# Patient Record
Sex: Female | Born: 1965 | Race: Black or African American | Hispanic: No | Marital: Single | Smoking: Never smoker
Health system: Southern US, Community
[De-identification: ages and names within clinical notes are randomized; demographics above are authoritative.]

## PROBLEM LIST (undated history)

## (undated) DIAGNOSIS — N390 Urinary tract infection, site not specified: Secondary | ICD-10-CM

## (undated) DIAGNOSIS — N920 Excessive and frequent menstruation with regular cycle: Secondary | ICD-10-CM

## (undated) DIAGNOSIS — N946 Dysmenorrhea, unspecified: Secondary | ICD-10-CM

## (undated) DIAGNOSIS — B019 Varicella without complication: Secondary | ICD-10-CM

## (undated) DIAGNOSIS — M1990 Unspecified osteoarthritis, unspecified site: Secondary | ICD-10-CM

## (undated) DIAGNOSIS — R32 Unspecified urinary incontinence: Secondary | ICD-10-CM

## (undated) HISTORY — PX: NO PAST SURGERIES: SHX2092

## (undated) HISTORY — DX: Excessive and frequent menstruation with regular cycle: N92.0

## (undated) HISTORY — DX: Unspecified urinary incontinence: R32

## (undated) HISTORY — DX: Varicella without complication: B01.9

## (undated) HISTORY — DX: Unspecified osteoarthritis, unspecified site: M19.90

## (undated) HISTORY — DX: Urinary tract infection, site not specified: N39.0

## (undated) HISTORY — PX: WISDOM TOOTH EXTRACTION: SHX21

## (undated) HISTORY — DX: Dysmenorrhea, unspecified: N94.6

## (undated) SURGERY — Surgical Case
Anesthesia: *Unknown

---

## 2012-11-20 HISTORY — PX: BUNIONECTOMY: SHX129

## 2013-01-28 ENCOUNTER — Encounter (HOSPITAL_COMMUNITY): Payer: Self-pay | Admitting: Pharmacist

## 2013-02-11 ENCOUNTER — Encounter (HOSPITAL_COMMUNITY)
Admission: RE | Admit: 2013-02-11 | Discharge: 2013-02-11 | Disposition: A | Discharge status: Home / Self Care | Payer: Commercial Managed Care - PPO | Source: Ambulatory Visit | Attending: Obstetrics and Gynecology | Admitting: Obstetrics and Gynecology

## 2013-02-11 ENCOUNTER — Other Ambulatory Visit: Payer: Self-pay | Admitting: Obstetrics and Gynecology

## 2013-02-11 ENCOUNTER — Encounter (HOSPITAL_COMMUNITY): Payer: Self-pay

## 2013-02-11 ENCOUNTER — Telehealth: Payer: Self-pay | Admitting: Radiology

## 2013-02-11 DIAGNOSIS — E876 Hypokalemia: Secondary | ICD-10-CM

## 2013-02-11 LAB — CBC
Hemoglobin: 12.3 g/dL (ref 12.0–15.0)
MCH: 30.5 pg (ref 26.0–34.0)
MCHC: 33 g/dL (ref 30.0–36.0)
MCV: 92.6 fL (ref 78.0–100.0)
RBC: 4.03 MIL/uL (ref 3.87–5.11)

## 2013-02-11 LAB — BASIC METABOLIC PANEL
BUN: 17 mg/dL (ref 6–23)
CO2: 28 mEq/L (ref 19–32)
Calcium: 9.7 mg/dL (ref 8.4–10.5)
Creatinine, Ser: 0.8 mg/dL (ref 0.50–1.10)
GFR calc non Af Amer: 88 mL/min — ABNORMAL LOW (ref >90)
Glucose, Bld: 84 mg/dL (ref 70–99)

## 2013-02-11 NOTE — Pre-Procedure Instructions (Signed)
Dr. Rodman Pickle notified of abnormal EKG, and advises to let pt's primary  MD know about EKG in case they want to follow up. Pt sees doctors at [facility]. I faxed copy of EKG to Dr. Tresa Res (c/o Kennon Rounds) and I notified Urgent Care.

## 2013-02-11 NOTE — Telephone Encounter (Signed)
Surgical center called to see if this patient is ours, she has stated she goes to Urgent care. I advised she does not seem to be established here.

## 2013-02-11 NOTE — Patient Instructions (Addendum)
Your procedure is scheduled on:02/17/13  Enter through the Main Entrance at :6am Pick up desk phone and dial [phone] and inform us of your arrival.  Please call [phone] if you have any problems the morning of surgery.  Remember: Do not eat or drink after midnight: Sunday   Take these meds the morning of surgery with a sip of water:Blood pressure med  DO NOT wear jewelry, eye make-up, lipstick,body lotion, or dark fingernail polish. Do not shave for 48 hours prior to surgery.  If you are to be admitted after surgery, leave suitcase in car until your room has been assigned. Patients discharged on the day of surgery will not be allowed to drive home.

## 2013-02-12 ENCOUNTER — Other Ambulatory Visit: Payer: Self-pay | Admitting: Obstetrics and Gynecology

## 2013-02-12 ENCOUNTER — Telehealth: Payer: Self-pay | Admitting: Obstetrics and Gynecology

## 2013-02-12 MED ORDER — POTASSIUM CHLORIDE ER 20 MEQ PO TBCR: 1.0000 | EXTENDED_RELEASE_TABLET | Freq: Every day | ORAL | Status: DC

## 2013-02-12 NOTE — Pre-Procedure Instructions (Signed)
Informed Dr Rodman Pickle of patient's low potassium level from yesterday's PAT with Marylu Lund - "K" level 2.9.  Cline Crock at Dr Romine's office to inform her that the patient needs potassium supplements per Dr Rodman Pickle prior to the surgery date of 02/17/13.  Fax a copy of the lab results to [facility] at Dr Romine's office.  Will repeat stat K level per Dr Rodman Pickle on [facility] 02/17/13 in [facility] prior to surgery.

## 2013-02-12 NOTE — Telephone Encounter (Signed)
Okey Regal from [facility] called to notify Dr Tresa Res that patients K+ level 2.9 and Dr Rodman Pickle in anesthesia wants the patient to be on K+ supplement until surgery.  Dr Rodman Pickle feels this is cause of EKG abnormality and will not cancel the surgery based on this.  See copy of fax reports from hospital.

## 2013-02-12 NOTE — Telephone Encounter (Signed)
Message copied by Ernest Haber on Wed Feb 12, 2013  3:04 PM ------      Message from: Meredeth Ide P      Created: Wed Feb 12, 2013  1:57 PM       Please call pt and tell her I want her to start postassium supplement prior to her surgery, and I sent it in to her [facility].  She should go ahead and get it now and start it, once daily with food, and still get her blood checked as planned.             Thanks. ------

## 2013-02-12 NOTE — H&P (Signed)
Judy Carroll is an 47 y.o. female.   Chief Complaint: heavy vaginal bleeding HPI: 47 yo SBF G1P1 (SVD 7 # 9 oz) with disabling menorrhagia and dysmenorrhea, desires definitive therapy with R-TLH.  PUS done 2.26.2014 showed her uterus to be 11 x 5 x 6 cm with multiple fibroids, each 1-3 cm.  Adnexa n.  EMB benign.  Past Medical History  Diagnosis Date  . Hypertension     Past Surgical History  Procedure Laterality Date  . No past surgeries      No family history on file. Social History:  reports that she has never smoked. She does not have any smokeless tobacco history on file. She reports that  drinks alcohol. She reports that she does not use illicit drugs.Daughter Fleet Contras will be support person.  Allergies: No Known Allergies  Meds:  Lisinopril 20/25 mg po qd  Results for orders placed during the hospital encounter of 02/11/13 (from the past 48 hour(s))  SURGICAL PCR SCREEN     Status: None   Collection Time    02/11/13  4:00 PM      Result Value Range   MRSA, PCR NEGATIVE  NEGATIVE   Staphylococcus aureus NEGATIVE  NEGATIVE   Comment:            The Xpert SA Assay (FDA     approved for NASAL specimens     in patients over 45 years of age),     is one component of     a comprehensive surveillance     program.  Test performance has     been validated by [employer] for patients greater     than or equal to 54 year old.     It is not intended     to diagnose infection nor to     guide or monitor treatment.  CBC     Status: None   Collection Time    02/11/13  4:25 PM      Result Value Range   WBC 8.7  4.0 - 10.5 K/uL   RBC 4.03  3.87 - 5.11 MIL/uL   Hemoglobin 12.3  12.0 - 15.0 g/dL   HCT 16.1  09.6 - 04.5 %   MCV 92.6  78.0 - 100.0 fL   MCH 30.5  26.0 - 34.0 pg   MCHC 33.0  30.0 - 36.0 g/dL   RDW 40.9  81.1 - 91.4 %   Platelets 383  150 - 400 K/uL  BASIC METABOLIC PANEL     Status: Abnormal   Collection Time    02/11/13  4:25 PM      Result Value Range   Sodium 136  135 - 145 mEq/L   Potassium 2.9 (*) 3.5 - 5.1 mEq/L   Chloride 98  96 - 112 mEq/L   CO2 28  19 - 32 mEq/L   Glucose, Bld 84  70 - 99 mg/dL   BUN 17  6 - 23 mg/dL   Creatinine, Ser 7.82  0.50 - 1.10 mg/dL   Calcium 9.7  8.4 - 95.6 mg/dL   GFR calc non Af Amer 88 (*) >90 mL/min   GFR calc Af Amer >90  >90 mL/min   Comment:            The eGFR has been calculated     using the CKD EPI equation.     This calculation has not been     validated in all clinical  situations.     eGFR's persistently     <90 mL/min signify     possible Chronic Kidney Disease.   No results found.  Review of Systems  All other systems reviewed and are negative.    There were no vitals taken for this visit. Physical Exam  Constitutional: She is oriented to person, place, and time. She appears well-developed and well-nourished.  HENT:  Head: Normocephalic and atraumatic.  Eyes: Conjunctivae are normal.  Neck: No thyromegaly present.  Cardiovascular: Normal rate and regular rhythm.   Respiratory: Effort normal and breath sounds normal.  GI: Soft. Bowel sounds are normal. There is no tenderness.  Genitourinary: Vagina normal.  Uterus difficult to palpate but on USG was 11 x 5 x 6 cm and adnexa nl.  Musculoskeletal: Normal range of motion.  Neurological: She is alert and oriented to person, place, and time.  Skin: Skin is warm and dry.  Psychiatric: She has a normal mood and affect.     Assessment/Plan Menorrhagia and dysmenorrhea, with fibroids, for R-TLH and bilat salpingectomy.  ROMINE,CYNTHIA P 02/12/2013, 3:41 PM

## 2013-02-12 NOTE — Telephone Encounter (Signed)
Pt is having surgery with Dr.Romine on Monday and wants to know if she can have her labs done at some where else.

## 2013-02-12 NOTE — Telephone Encounter (Signed)
T/C to pt. Advised her that Dr. Tresa Res would like her to start a potassium supplement once a day with food prior to her surgery. And a Rx was called to her [employer], still have labs done as planned. cm

## 2013-02-13 ENCOUNTER — Other Ambulatory Visit: Payer: Self-pay | Admitting: *Deleted

## 2013-02-13 ENCOUNTER — Other Ambulatory Visit: Payer: Self-pay | Admitting: Obstetrics & Gynecology

## 2013-02-13 DIAGNOSIS — E876 Hypokalemia: Secondary | ICD-10-CM

## 2013-02-14 LAB — POTASSIUM: Potassium: 3.8 mEq/L (ref 3.5–5.3)

## 2013-02-16 MED ORDER — DEXTROSE 5 % IV SOLN
2.0000 g | INTRAVENOUS | Status: AC
  Administered 2013-02-17: 2 g via INTRAVENOUS
  Filled 2013-02-16: qty 2

## 2013-02-17 ENCOUNTER — Encounter (HOSPITAL_COMMUNITY)
Admission: RE | Disposition: A | Discharge status: Home / Self Care | Payer: Self-pay | Source: Ambulatory Visit | Attending: Obstetrics and Gynecology

## 2013-02-17 ENCOUNTER — Encounter (HOSPITAL_COMMUNITY): Payer: Self-pay | Admitting: Anesthesiology

## 2013-02-17 ENCOUNTER — Ambulatory Visit (HOSPITAL_COMMUNITY): Payer: Commercial Managed Care - PPO | Admitting: Anesthesiology

## 2013-02-17 ENCOUNTER — Ambulatory Visit (HOSPITAL_COMMUNITY)
Admission: RE | Admit: 2013-02-17 | Discharge: 2013-02-17 | Disposition: A | Discharge status: Home / Self Care | Payer: Commercial Managed Care - PPO | Source: Ambulatory Visit | Attending: Obstetrics and Gynecology | Admitting: Obstetrics and Gynecology

## 2013-02-17 ENCOUNTER — Encounter (HOSPITAL_COMMUNITY): Payer: Self-pay | Admitting: *Deleted

## 2013-02-17 DIAGNOSIS — D251 Intramural leiomyoma of uterus: Secondary | ICD-10-CM | POA: Insufficient documentation

## 2013-02-17 DIAGNOSIS — N920 Excessive and frequent menstruation with regular cycle: Secondary | ICD-10-CM | POA: Insufficient documentation

## 2013-02-17 DIAGNOSIS — D259 Leiomyoma of uterus, unspecified: Secondary | ICD-10-CM

## 2013-02-17 DIAGNOSIS — N838 Other noninflammatory disorders of ovary, fallopian tube and broad ligament: Secondary | ICD-10-CM | POA: Insufficient documentation

## 2013-02-17 DIAGNOSIS — N946 Dysmenorrhea, unspecified: Secondary | ICD-10-CM | POA: Insufficient documentation

## 2013-02-17 HISTORY — PX: CYSTOSCOPY: SHX5120

## 2013-02-17 HISTORY — PX: BILATERAL SALPINGECTOMY: SHX5743

## 2013-02-17 HISTORY — PX: ROBOTIC ASSISTED TOTAL HYSTERECTOMY: SHX6085

## 2013-02-17 SURGERY — ROBOTIC ASSISTED TOTAL HYSTERECTOMY
Anesthesia: General

## 2013-02-17 MED ORDER — INDIGOTINDISULFONATE SODIUM 8 MG/ML IJ SOLN
INTRAMUSCULAR | Status: AC
  Filled 2013-02-17: qty 5

## 2013-02-17 MED ORDER — OXYCODONE-ACETAMINOPHEN 5-325 MG PO TABS
1.0000 | ORAL_TABLET | ORAL | Status: DC | PRN
  Administered 2013-02-17: 2 via ORAL
  Filled 2013-02-17: qty 2

## 2013-02-17 MED ORDER — ACETAMINOPHEN 10 MG/ML IV SOLN
INTRAVENOUS | Status: AC
  Administered 2013-02-17: 1000 mg via INTRAVENOUS
  Filled 2013-02-17: qty 100

## 2013-02-17 MED ORDER — ARTIFICIAL TEARS OP OINT
TOPICAL_OINTMENT | OPHTHALMIC | Status: AC
  Filled 2013-02-17: qty 3.5

## 2013-02-17 MED ORDER — GABAPENTIN 300 MG PO CAPS
600.0000 mg | ORAL_CAPSULE | Freq: Once | ORAL | Status: AC
  Administered 2013-02-17: 600 mg via ORAL
  Filled 2013-02-17: qty 2

## 2013-02-17 MED ORDER — DEXAMETHASONE SODIUM PHOSPHATE 4 MG/ML IJ SOLN
INTRAMUSCULAR | Status: DC | PRN
  Administered 2013-02-17: 10 mg via INTRAVENOUS

## 2013-02-17 MED ORDER — GLYCOPYRROLATE 0.2 MG/ML IJ SOLN
INTRAMUSCULAR | Status: DC | PRN
  Administered 2013-02-17: 0.1 mg via INTRAVENOUS

## 2013-02-17 MED ORDER — EPHEDRINE 5 MG/ML INJ
INTRAVENOUS | Status: DC | PRN
  Administered 2013-02-17: 15 mg via INTRAVENOUS
  Administered 2013-02-17: 10 mg via INTRAVENOUS

## 2013-02-17 MED ORDER — EPHEDRINE 5 MG/ML INJ
INTRAVENOUS | Status: AC
  Filled 2013-02-17: qty 10

## 2013-02-17 MED ORDER — PROPOFOL 10 MG/ML IV EMUL
INTRAVENOUS | Status: AC
  Filled 2013-02-17: qty 20

## 2013-02-17 MED ORDER — MORPHINE SULFATE 4 MG/ML IJ SOLN: 1.0000 mg | INTRAMUSCULAR | Status: DC | PRN

## 2013-02-17 MED ORDER — CELECOXIB 200 MG PO CAPS
400.0000 mg | ORAL_CAPSULE | ORAL | Status: AC
  Administered 2013-02-17: 400 mg via ORAL
  Filled 2013-02-17: qty 2

## 2013-02-17 MED ORDER — MIDAZOLAM HCL 2 MG/2ML IJ SOLN
INTRAMUSCULAR | Status: AC
  Filled 2013-02-17: qty 2

## 2013-02-17 MED ORDER — PROPOFOL 10 MG/ML IV EMUL
INTRAVENOUS | Status: DC | PRN
  Administered 2013-02-17: 170 mg via INTRAVENOUS
  Administered 2013-02-17: 30 mg via INTRAVENOUS

## 2013-02-17 MED ORDER — ROCURONIUM BROMIDE 100 MG/10ML IV SOLN
INTRAVENOUS | Status: DC | PRN
  Administered 2013-02-17: 20 mg via INTRAVENOUS
  Administered 2013-02-17: 5 mg via INTRAVENOUS
  Administered 2013-02-17: 4 mg via INTRAVENOUS
  Administered 2013-02-17: 45 mg via INTRAVENOUS

## 2013-02-17 MED ORDER — ONDANSETRON HCL 4 MG/2ML IJ SOLN
INTRAMUSCULAR | Status: AC
  Filled 2013-02-17: qty 2

## 2013-02-17 MED ORDER — GLYCOPYRROLATE 0.2 MG/ML IJ SOLN
INTRAMUSCULAR | Status: AC
  Filled 2013-02-17: qty 1

## 2013-02-17 MED ORDER — LIDOCAINE HCL (CARDIAC) 20 MG/ML IV SOLN
INTRAVENOUS | Status: AC
  Filled 2013-02-17: qty 5

## 2013-02-17 MED ORDER — LACTATED RINGERS IR SOLN
Status: DC | PRN
  Administered 2013-02-17: 3000 mL

## 2013-02-17 MED ORDER — LIDOCAINE HCL (CARDIAC) 20 MG/ML IV SOLN
INTRAVENOUS | Status: DC | PRN
  Administered 2013-02-17 (×2): 50 mg via INTRAVENOUS

## 2013-02-17 MED ORDER — MIDAZOLAM HCL 5 MG/5ML IJ SOLN
INTRAMUSCULAR | Status: DC | PRN
  Administered 2013-02-17: 2 mg via INTRAVENOUS

## 2013-02-17 MED ORDER — NEOSTIGMINE METHYLSULFATE 1 MG/ML IJ SOLN
INTRAMUSCULAR | Status: AC
  Filled 2013-02-17: qty 1

## 2013-02-17 MED ORDER — DEXTROSE IN LACTATED RINGERS 5 % IV SOLN
INTRAVENOUS | Status: DC
  Administered 2013-02-17: 13:00:00 via INTRAVENOUS

## 2013-02-17 MED ORDER — FENTANYL CITRATE 0.05 MG/ML IJ SOLN
INTRAMUSCULAR | Status: AC
  Filled 2013-02-17: qty 5

## 2013-02-17 MED ORDER — ACETAMINOPHEN 10 MG/ML IV SOLN: 1000.0000 mg | Freq: Once | INTRAVENOUS | Status: AC

## 2013-02-17 MED ORDER — HYDROMORPHONE HCL PF 1 MG/ML IJ SOLN: 0.2500 mg | INTRAMUSCULAR | Status: DC | PRN

## 2013-02-17 MED ORDER — ROPIVACAINE HCL 5 MG/ML IJ SOLN
INTRAMUSCULAR | Status: DC | PRN
  Administered 2013-02-17: 90 mL via EPIDURAL

## 2013-02-17 MED ORDER — ROPIVACAINE HCL 5 MG/ML IJ SOLN
INTRAMUSCULAR | Status: AC
  Filled 2013-02-17: qty 60

## 2013-02-17 MED ORDER — LACTATED RINGERS IV SOLN
INTRAVENOUS | Status: DC
  Administered 2013-02-17 (×2): via INTRAVENOUS

## 2013-02-17 MED ORDER — DEXAMETHASONE SODIUM PHOSPHATE 10 MG/ML IJ SOLN
INTRAMUSCULAR | Status: AC
  Filled 2013-02-17: qty 1

## 2013-02-17 MED ORDER — HYDROMORPHONE HCL PF 1 MG/ML IJ SOLN
INTRAMUSCULAR | Status: AC
  Administered 2013-02-17: 0.5 mg via INTRAVENOUS
  Filled 2013-02-17: qty 1

## 2013-02-17 MED ORDER — FENTANYL CITRATE 0.05 MG/ML IJ SOLN
INTRAMUSCULAR | Status: DC | PRN
  Administered 2013-02-17 (×2): 100 ug via INTRAVENOUS
  Administered 2013-02-17: 50 ug via INTRAVENOUS

## 2013-02-17 MED ORDER — INDIGOTINDISULFONATE SODIUM 8 MG/ML IJ SOLN
INTRAMUSCULAR | Status: DC | PRN
  Administered 2013-02-17: 5 mL via INTRAVENOUS

## 2013-02-17 SURGICAL SUPPLY — 74 items
BAG URINE DRAINAGE (UROLOGICAL SUPPLIES) ×3 IMPLANT
BARRIER ADHS 3X4 INTERCEED (GAUZE/BANDAGES/DRESSINGS) ×3 IMPLANT
BENZOIN TINCTURE PRP APPL 2/3 (GAUZE/BANDAGES/DRESSINGS) ×3 IMPLANT
CHLORAPREP W/TINT 26ML (MISCELLANEOUS) ×3 IMPLANT
CONT PATH 16OZ SNAP LID 3702 (MISCELLANEOUS) ×3 IMPLANT
COVER MAYO STAND STRL (DRAPES) ×3 IMPLANT
COVER TABLE BACK 60X90 (DRAPES) ×6 IMPLANT
COVER TIP SHEARS 8 DVNC (MISCELLANEOUS) ×2 IMPLANT
COVER TIP SHEARS 8MM DA VINCI (MISCELLANEOUS) ×1
DECANTER SPIKE VIAL GLASS SM (MISCELLANEOUS) ×3 IMPLANT
DERMABOND ADVANCED (GAUZE/BANDAGES/DRESSINGS) ×1
DERMABOND ADVANCED .7 DNX12 (GAUZE/BANDAGES/DRESSINGS) ×2 IMPLANT
DRAPE HUG U DISPOSABLE (DRAPE) ×3 IMPLANT
DRAPE LG THREE QUARTER DISP (DRAPES) ×6 IMPLANT
DRAPE WARM FLUID 44X44 (DRAPE) ×3 IMPLANT
ELECT REM PT RETURN 9FT ADLT (ELECTROSURGICAL) ×3
ELECTRODE REM PT RTRN 9FT ADLT (ELECTROSURGICAL) ×2 IMPLANT
EVACUATOR SMOKE 8.L (FILTER) ×3 IMPLANT
GAUZE VASELINE 3X9 (GAUZE/BANDAGES/DRESSINGS) IMPLANT
GLOVE BIO SURGEON STRL SZ 6.5 (GLOVE) ×6 IMPLANT
GLOVE BIOGEL PI IND STRL 7.0 (GLOVE) ×4 IMPLANT
GLOVE BIOGEL PI INDICATOR 7.0 (GLOVE) ×2
GLOVE ECLIPSE 6.5 STRL STRAW (GLOVE) ×12 IMPLANT
GOWN STRL REIN XL XLG (GOWN DISPOSABLE) ×18 IMPLANT
GYRUS RUMI II 2.5CM BLUE (DISPOSABLE)
GYRUS RUMI II 3.5CM BLUE (DISPOSABLE)
GYRUS RUMI II 4.0CM BLUE (DISPOSABLE)
KIT ACCESSORY DA VINCI DISP (KITS) ×1
KIT ACCESSORY DVNC DISP (KITS) ×2 IMPLANT
LEGGING LITHOTOMY PAIR STRL (DRAPES) ×3 IMPLANT
NEEDLE INSUFFLATION 120MM (ENDOMECHANICALS) ×3 IMPLANT
OCCLUDER COLPOPNEUMO (BALLOONS) ×3 IMPLANT
PACK LAVH (CUSTOM PROCEDURE TRAY) ×3 IMPLANT
PAD PREP 24X48 CUFFED NSTRL (MISCELLANEOUS) ×6 IMPLANT
PLUG CATH AND CAP STER (CATHETERS) ×3 IMPLANT
PROTECTOR NERVE ULNAR (MISCELLANEOUS) ×6 IMPLANT
RUMI II 3.0CM BLUE KOH-EFFICIE (DISPOSABLE) IMPLANT
RUMI II GYRUS 2.5CM BLUE (DISPOSABLE) IMPLANT
RUMI II GYRUS 3.5CM BLUE (DISPOSABLE) IMPLANT
RUMI II GYRUS 4.0CM BLUE (DISPOSABLE) IMPLANT
SET CYSTO W/LG BORE CLAMP LF (SET/KITS/TRAYS/PACK) ×3 IMPLANT
SET IRRIG TUBING LAPAROSCOPIC (IRRIGATION / IRRIGATOR) ×3 IMPLANT
SOLUTION ELECTROLUBE (MISCELLANEOUS) ×3 IMPLANT
STRIP CLOSURE SKIN 1/4X4 (GAUZE/BANDAGES/DRESSINGS) ×3 IMPLANT
SUT VIC AB 0 CT1 27 (SUTURE) ×3
SUT VIC AB 0 CT1 27XBRD ANBCTR (SUTURE) ×6 IMPLANT
SUT VIC AB 0 CT1 27XBRD ANTBC (SUTURE) IMPLANT
SUT VICRYL 0 UR6 27IN ABS (SUTURE) ×3 IMPLANT
SUT VICRYL RAPIDE 4/0 PS 2 (SUTURE) ×6 IMPLANT
SUT VLOC 180 0 9IN  GS21 (SUTURE)
SUT VLOC 180 0 9IN GS21 (SUTURE) IMPLANT
SUT VLOC 180 3-0 9IN GS21 (SUTURE) ×3 IMPLANT
SYR 30ML LL (SYRINGE) ×3 IMPLANT
SYR 50ML LL SCALE MARK (SYRINGE) ×3 IMPLANT
SYRINGE 10CC LL (SYRINGE) ×3 IMPLANT
SYSTEM CONVERTIBLE TROCAR (TROCAR) IMPLANT
TIP RUMI ORANGE 6.7MMX12CM (TIP) IMPLANT
TIP UTERINE 5.1X6CM LAV DISP (MISCELLANEOUS) IMPLANT
TIP UTERINE 6.7X10CM GRN DISP (MISCELLANEOUS) ×3 IMPLANT
TIP UTERINE 6.7X6CM WHT DISP (MISCELLANEOUS) IMPLANT
TIP UTERINE 6.7X8CM BLUE DISP (MISCELLANEOUS) IMPLANT
TOWEL OR 17X24 6PK STRL BLUE (TOWEL DISPOSABLE) ×9 IMPLANT
TRAY FOLEY BAG SILVER LF 14FR (CATHETERS) ×3 IMPLANT
TROCAR 12M 150ML BLUNT (TROCAR) IMPLANT
TROCAR DILATING TIP 12MM 150MM (ENDOMECHANICALS) ×3 IMPLANT
TROCAR DISP BLADELESS 8 DVNC (TROCAR) ×2 IMPLANT
TROCAR DISP BLADELESS 8MM (TROCAR) ×1
TROCAR XCEL 12X100 BLDLESS (ENDOMECHANICALS) IMPLANT
TROCAR XCEL NON-BLD 5MMX100MML (ENDOMECHANICALS) ×3 IMPLANT
TROCAR XCEL OPT SLVE 5M 100M (ENDOMECHANICALS) ×3 IMPLANT
TUBING FILTER THERMOFLATOR (ELECTROSURGICAL) ×3 IMPLANT
TUBING INSUFFLATION 10FT LAP (TUBING) ×3 IMPLANT
WARMER LAPAROSCOPE (MISCELLANEOUS) ×3 IMPLANT
WATER STERILE IRR 1000ML POUR (IV SOLUTION) ×9 IMPLANT

## 2013-02-17 NOTE — Progress Notes (Signed)
Patient discharge via wheel chair

## 2013-02-17 NOTE — Interval H&P Note (Signed)
History and Physical Interval Note:  02/17/2013 7:15 AM  Judy Carroll  has presented today for surgery, with the diagnosis of Menorrhagia; Fibroids CPT 58573, [id number], 5200  The various methods of treatment have been discussed with the patient and family. After consideration of risks, benefits and other options for treatment, the patient has consented to  Procedure(s) with comments: ROBOTIC ASSISTED TOTAL HYSTERECTOMY (N/A) - with cysto as a surgical intervention .  The patient's history has been reviewed, patient examined, no change in status, stable for surgery.  I have reviewed the patient's chart and labs.  Questions were answered to the patient's satisfaction.     ROMINE,CYNTHIA P

## 2013-02-17 NOTE — Anesthesia Preprocedure Evaluation (Signed)
Anesthesia Evaluation  Patient identified by MRN, date of birth, ID band Patient awake    Reviewed: Allergy & Precautions, H&P , Patient's Chart, lab work & pertinent test results, reviewed documented beta blocker date and time   Airway Mallampati: IV TM Distance: >3 FB Neck ROM: full    Dental no notable dental hx.    Pulmonary  breath sounds clear to auscultation  Pulmonary exam normal       Cardiovascular hypertension, On Medications Rhythm:regular Rate:Normal     Neuro/Psych    GI/Hepatic   Endo/Other  Morbid obesity  Renal/GU      Musculoskeletal   Abdominal   Peds  Hematology   Anesthesia Other Findings Repeat K+ okay  Reproductive/Obstetrics                           Anesthesia Physical Anesthesia Plan  ASA: III  Anesthesia Plan: General   Post-op Pain Management:    Induction: Intravenous  Airway Management Planned: Oral ETT and Video Laryngoscope Planned  Additional Equipment:   Intra-op Plan:   Post-operative Plan:   Informed Consent: I have reviewed the patients History and Physical, chart, labs and discussed the procedure including the risks, benefits and alternatives for the proposed anesthesia with the patient or authorized representative who has indicated his/her understanding and acceptance.   Dental Advisory Given and Dental advisory given  Plan Discussed with: CRNA and Surgeon  Anesthesia Plan Comments: (  Discussed  general anesthesia, including possible nausea, instrumentation of airway, sore throat,pulmonary aspiration, etc. I asked if the were any outstanding questions, or  concerns before we proceeded. )        Anesthesia Quick Evaluation

## 2013-02-17 NOTE — Progress Notes (Signed)
MD called. Given report on patient status and ready for discharge. No new orders.

## 2013-02-17 NOTE — Anesthesia Postprocedure Evaluation (Signed)
Anesthesia Post Note  Patient: Judy Carroll  Procedure(s) Performed: Procedure(s) (LRB): ROBOTIC ASSISTED TOTAL HYSTERECTOMY (N/A) BILATERAL SALPINGECTOMY (Bilateral) CYSTOSCOPY (N/A)  Anesthesia type: General  Patient location: PACU  Post pain: Pain level controlled  Post assessment: Post-op Vital signs reviewed  Last Vitals:  Filed Vitals:   02/17/13 1015  BP: 126/67  Pulse: 75  Temp:   Resp: 23    Post vital signs: Reviewed  Level of consciousness: sedated  Complications: No apparent anesthesia complications

## 2013-02-17 NOTE — Transfer of Care (Signed)
Immediate Anesthesia Transfer of Care Note  Patient: Judy Carroll  Procedure(s) Performed: Procedure(s): ROBOTIC ASSISTED TOTAL HYSTERECTOMY (N/A) BILATERAL SALPINGECTOMY (Bilateral) CYSTOSCOPY (N/A)  Patient Location: PACU  Anesthesia Type:General  Level of Consciousness: awake, alert  and oriented  Airway & Oxygen Therapy: Patient Spontanous Breathing and Patient connected to nasal cannula oxygen  Post-op Assessment: Report given to PACU RN and Post -op Vital signs reviewed and stable  Post vital signs: Reviewed and stable  Complications: No apparent anesthesia complications

## 2013-02-17 NOTE — Op Note (Signed)
Preoperative diagnosis: Menorrhagia, fibroids Postoperative diagnosis: Same, path pending Procedure: Robotic assisted total laparoscopic hysterectomy, bilateral salpingectomy, cystoscopy Surgeon: Dr. Meredeth Ide Assistant: Dr. Leda Quail Anesthesia: Gen. endotracheal Estimated blood loss: 75 cc Complications: None  Procedure: The patient was taken to the operating room, and after she was given IV sedation, she was placed in the low dorsolithotomy position. General endotracheal anesthesia was induced. She was then prepped and draped in usual fashion.  A posterior weighted and anterior Deaver retractor were placed into the vagina.  The cervix was grasped on its anterior lip with a single-tooth tenaculum.  The cervix was dilated to a #19 Shawnie Pons.  The uterus sounded to 10 cm. A 10 cm Rumi manipulator with a small  KOH ring was placed without difficulty.  A Foley catheter was then placed.  Meanwhile the assistant had anesthetized the skin in the left upper quadrant with quarter percent ropivacaine, incised the skin, and inserted a 5 mm non-bladed trocar into the peritoneal space.  Proper placement was noted using the laparoscope.  Pneumoperitoneum was accomplished using the automatic insufflator.  The skin just above the umbilicus was anesthetized with quarter percent ropivacaine incised with a knife, and a 12 mm bladed trocar was then inserted into the peritoneal space and its proper placement noted with the laparoscope.  60 cc of quarter percent Ropivicaine was inserted into the peritoneal space.  Sites were marked for the robotic trocars ,the abdominal wall was transilluminated, and then the skin was anesthetized with quarter percent ropivicaine, incised with a knife, and the trocars were inserted under direct visualization.  The patient was then placed in steep Trendelenburg position.  The robot was brought in and docked.  Monopolar scissors were inserted through port one and the PK gyrus through port  two, both under direct visualization.  The surgeon then proceeded to the console.  The ureters were identified and seen peristalsing bilaterally.  The procedure began on the patient's right, identifying the fallopian tube, and separating it from the ovary and along the length of the mesosalpinx with serial coagulation and cutting.  The utero-ovarian ligament was then cauterized and cut.  The round ligament on the right was cauterized and cut.  The anterior and posterior leaves of the broad ligament were taken down sharply.  The bladder was dissected sharply off the [facility] ring.  The uterine artery was skeletonized, coagulated multiple times, and cut.  Attention was next turned to the patient's left.  The tube was likewise cauterized and cut along its length to separate it from the ovary and the mesosalpinx.  The utero-ovarian ligament on the left was cauterized and cut.  The round ligament on the left was cauterized and cut.  The anterior posterior leaves of the broad ligament were taken down sharply.  Further dissection was done to dissect the bladder off the [facility] ring.  The left uterine artery was skeletonized, coagulated multiple times, and cut.  An approximately 4 cm left lower uterine fibroid was incised and removed in order to allow the uterus to fit through the opening in the vagina made by the small Koh ring.  A circumferential colpotomy incision was then made with monopolar scissors.  The uterus was then delivered into the vagina with the fallopian tubes attached.  The free fibroid was also then delivered into the vagina.  Robotic instruments were then changed out for a mega-suture cut needle driver in port 1 and a Cobra grasper in port 2.   A V. LOC suture was  dropped in through the trocar at the umbilicus, and carried into the pelvis.  The vaginal cuff was closed with a running V. LOC suture.  The needle was parked in the right paracolic gutter.  The pelvis was irrigated and found to be hemostatic.  The  ureters were again identified and seen peristalsing bilaterally.  A sheet of Interceed was brought in through one of the robotic trocars and placed over the vaginal cuff.  The other half of the Interceed was then also brought in and wrapped around the right ovary.  The right ovary contained a smooth cyst that had been incised with the monopolar scissors during the surgery and was bleeding.  Bleeding was stopped with monopolar coagulation, but it was felt that the Interceed would be helpful to prevent it from adhering to the sidewall. Robotic instruments were then removed.  An 8mm laparoscope was used to enable a [place] forceps to go in through the umbilical port and retrieve the needle the that had been parked in the right paracolic gutter.  Trocars were then removed under direct visualization.  Pneumoperitoneum was allowed to escape.  While the umbilical trocar was still in place, the anesthetist was asked to give the patient manual deep breaths to assist in removing the CO2 from the abdomen.  That trocar was then removed.  The fascia was closed at the umbilicus with a single suture of 0 Vicryl.  The incisions were then closed with 4-0 Vicryl Rapide, and Dermabond.  The surgeon then proceeded to perform cystoscopy.  The Foley catheter was removed.  The cystoscope was inserted and 200 cc of sterile water was instilled into the bladder.  Approximately 10 minutes before the cystoscopy started, an amp of indigo carmine had been injected per anesthesia IV.  Good efflux of indigo carmine could be seen from both ureteral orifices. The dome of the bladder was inspected and was normal.  The scope was withdrawn, and the bladder was drained.  Procedure was terminated.  Sponge needle and instrument counts were correct x3.  The patient was taken to the recovery room in satisfactory condition.

## 2013-02-18 ENCOUNTER — Encounter (HOSPITAL_COMMUNITY): Payer: Self-pay | Admitting: Obstetrics and Gynecology

## 2013-02-19 ENCOUNTER — Telehealth: Payer: Self-pay | Admitting: Obstetrics and Gynecology

## 2013-02-19 NOTE — Telephone Encounter (Signed)
Pt is returning call.  

## 2013-02-20 ENCOUNTER — Telehealth: Payer: Self-pay | Admitting: *Deleted

## 2013-02-20 NOTE — Telephone Encounter (Signed)
Spoke with patient notified her of path report all benign, Asked how she was doing some soreness still. I explained to her that this was Normal and if she needs to take something ibuprofen 800mg  every 8hrs is ok. Soreness may come and go for several weeks and This again is normal. If she feels like soreness is getting worse to give [place] a call, if not we will see her on her post op appt.

## 2013-02-20 NOTE — Telephone Encounter (Signed)
error 

## 2013-03-06 ENCOUNTER — Telehealth: Payer: Self-pay | Admitting: Obstetrics and Gynecology

## 2013-03-06 NOTE — Telephone Encounter (Signed)
Can you give pt a note to return to work effective April 21st?  Had hysterectomy 3/31 with [facility].  Ok to fax or mail to [employer] she wants.

## 2013-03-06 NOTE — Telephone Encounter (Signed)
Spoke with pt about work note. Instructed pt she is not to lift anything over 10 lbs until after 03-31-13. Pt agreeable and will come by the office to pick up note today.  aa

## 2013-03-06 NOTE — Telephone Encounter (Signed)
Pt would like a work note to return after surgery.

## 2013-03-06 NOTE — Telephone Encounter (Signed)
Spoke with pt about work note. Pt would like to return to work Monday if possible.

## 2013-03-12 NOTE — Telephone Encounter (Signed)
Pt would like to move postop appt up if possible so she can go back to work.

## 2013-03-13 NOTE — Telephone Encounter (Signed)
She needs OV before we can release to return to work.

## 2013-03-13 NOTE — Telephone Encounter (Signed)
Patient reports that she did not return to work this past Monday as she was initially given an note for (per her own request).  She states she had vaginal bleeding over the weekend and so she did not return to work but wants new note to return this Monday 03-18-13.  States bleeding is down to a light pink and pelvic pain is "not as bad".  Advised that pain at 3 weeks post op is not uncommon, especially if doing too much activitty but this may get worse if returns to work.  Will need to check with MD before giving a new return to work note.  Please advise. OV first?

## 2013-03-14 ENCOUNTER — Ambulatory Visit (INDEPENDENT_AMBULATORY_CARE_PROVIDER_SITE_OTHER): Payer: Commercial Managed Care - PPO | Admitting: Obstetrics & Gynecology

## 2013-03-14 ENCOUNTER — Encounter: Payer: Self-pay | Admitting: Obstetrics & Gynecology

## 2013-03-14 VITALS — BP 124/80 | HR 64 | Resp 16

## 2013-03-14 DIAGNOSIS — IMO0002 Reserved for concepts with insufficient information to code with codable children: Secondary | ICD-10-CM

## 2013-03-14 NOTE — Telephone Encounter (Signed)
LMTCB  aa 

## 2013-03-14 NOTE — Patient Instructions (Signed)
Please call if you have any future problems.  We will see you in a year, otherwise.

## 2013-03-14 NOTE — Telephone Encounter (Signed)
Spoke with pt about need for OV for checkup in order to get new work note. Pt agreeable. Sched OV today at 1115 with SM.

## 2013-03-14 NOTE — Progress Notes (Signed)
47 y.o. Single AA female here for post-op visit and to assess bleeding.  She has done really well since her surgery.  Took pain medication for only three or four days.  Spotted for a few days post-op.  Was planning on returning to work Monday, April 21, but she has bright red bleeding Sat and Sun.  This wasn't heavy but she did have a lot of cramping.  She took OTC advil for this.  Bleeding was dark and spotty for three more days.  She wants to go back to work 4/28.  Called for another note.  I asked her to come in and be seen.  No pain now.  No bladder or bowel issues.  Normal bowel movements.  Off pain medications.     Exam:   BP 124/80  Pulse 64  Resp 16  LMP 01/27/2013    General appearance: alert and cooperative Abdomen:abdomen is soft without significant tenderness, masses, organomegaly or guarding Inc:  C/D/Il Pelvic: Pelvic Exam Female: no blood, cuff healing well without erythema, no masses or fullness or tenderness on BME  Assessment:  S/P robotic TLH/bilateral salpingectomy 02/17/13  Plan:  Pt may return to work 4/28 without limitations May have intercourse after six weeks F/u one year for AEX

## 2013-03-21 ENCOUNTER — Ambulatory Visit: Payer: Self-pay | Admitting: Obstetrics and Gynecology

## 2013-12-25 ENCOUNTER — Ambulatory Visit: Payer: Self-pay | Admitting: Nurse Practitioner

## 2014-01-19 ENCOUNTER — Ambulatory Visit: Payer: Commercial Managed Care - PPO | Admitting: Nurse Practitioner

## 2014-02-04 DIAGNOSIS — I10 Essential (primary) hypertension: Secondary | ICD-10-CM | POA: Insufficient documentation

## 2014-02-04 DIAGNOSIS — E559 Vitamin D deficiency, unspecified: Secondary | ICD-10-CM | POA: Insufficient documentation

## 2014-03-09 ENCOUNTER — Encounter: Payer: Self-pay | Admitting: Certified Nurse Midwife

## 2014-03-10 ENCOUNTER — Encounter: Payer: Self-pay | Admitting: Certified Nurse Midwife

## 2014-03-10 ENCOUNTER — Ambulatory Visit (INDEPENDENT_AMBULATORY_CARE_PROVIDER_SITE_OTHER): Payer: Commercial Managed Care - PPO | Admitting: Certified Nurse Midwife

## 2014-03-10 VITALS — BP 112/78 | HR 70 | Resp 16 | Ht 60.75 in | Wt 206.0 lb

## 2014-03-10 DIAGNOSIS — E669 Obesity, unspecified: Secondary | ICD-10-CM

## 2014-03-10 DIAGNOSIS — Z0000 Encounter for general adult medical examination without abnormal findings: Secondary | ICD-10-CM

## 2014-03-10 DIAGNOSIS — Z01419 Encounter for gynecological examination (general) (routine) without abnormal findings: Secondary | ICD-10-CM

## 2014-03-10 LAB — POCT URINALYSIS DIPSTICK
Bilirubin, UA: NEGATIVE
Blood, UA: NEGATIVE
Glucose, UA: NEGATIVE
Ketones, UA: NEGATIVE
Leukocytes, UA: NEGATIVE
Nitrite, UA: NEGATIVE
PROTEIN UA: NEGATIVE
UROBILINOGEN UA: NEGATIVE
pH, UA: 5

## 2014-03-10 NOTE — Patient Instructions (Signed)

## 2014-03-10 NOTE — Progress Notes (Signed)
48 y.o. G1P1001 Single African American Fe here for annual exam. No periods now! Feel so much better since hysterectomy for dysmenorrhea and menorrhagia.No partner change, no STD concerns. Sees PCP for hypertension management, with medication change. All labs with PCP. No health issues today.    Patient's last menstrual period was 01/27/2013.          Sexually active: no  The current method of family planning is status post hysterectomy.    Exercising: yes  walking Smoker:  no  Health Maintenance: Pap:  12-23-12 neg HPV HR neg MMG:  08-29-13 normal Colonoscopy: none BMD:   none TDaP: 2012 Labs: Poct urine-neg Self breast exam: not done   reports that she has never smoked. She has never used smokeless tobacco. She reports that she drinks alcohol. She reports that she does not use illicit drugs.  Past Medical History  Diagnosis Date  . Hypertension   . Menorrhagia   . Dysmenorrhea   . Urinary incontinence     Past Surgical History  Procedure Laterality Date  . No past surgeries    . Robotic assisted total hysterectomy N/A 02/17/2013    Procedure: ROBOTIC ASSISTED TOTAL HYSTERECTOMY;  Surgeon: Peri Maris, MD;  Location: [facility];  Service: Gynecology;  Laterality: N/A;  . Bilateral salpingectomy Bilateral 02/17/2013    Procedure: BILATERAL SALPINGECTOMY;  Surgeon: Peri Maris, MD;  Location: [facility];  Service: Gynecology;  Laterality: Bilateral;  . Cystoscopy N/A 02/17/2013    Procedure: CYSTOSCOPY;  Surgeon: Peri Maris, MD;  Location: [facility];  Service: Gynecology;  Laterality: N/A;  . Wisdom tooth extraction    . Bunionectomy Right 2014    Current Outpatient Prescriptions  Medication Sig Dispense Refill  . amLODipine (NORVASC) 5 MG tablet Take 5 mg by mouth daily.      Marland Kitchen ibuprofen (ADVIL,MOTRIN) 200 MG tablet Take 200 mg by mouth as needed for pain (liquid gels).      Marland Kitchen lisinopril-hydrochlorothiazide (PRINZIDE,ZESTORETIC) 20-25 MG per tablet Take 1 tablet by mouth  daily.      . Multiple Vitamins-Minerals (HAIR/SKIN/NAILS PO) Take by mouth daily.       No current facility-administered medications for this visit.    Family History  Problem Relation Age of Onset  . Heart failure Mother     chf  . Hypertension Mother   . Hypertension Father   . Hypertension Sister     ROS:  Pertinent items are noted in HPI.  Otherwise, a comprehensive ROS was negative.  Exam:   BP 112/78  Pulse 70  Resp 16  Ht 5' 0.75" (1.543 m)  Wt 206 lb (93.441 kg)  BMI 39.25 kg/m2  LMP 01/27/2013 Height: 5' 0.75" (154.3 cm)  Ht Readings from Last 3 Encounters:  03/10/14 5' 0.75" (1.543 m)  02/17/13 5\' 1"  (1.549 m)  02/17/13 5\' 1"  (1.549 m)    General appearance: alert, cooperative and appears stated age Head: Normocephalic, without obvious abnormality, atraumatic Neck: no adenopathy, supple, symmetrical, trachea midline and thyroid normal to inspection and palpation and non-palpable Lungs: clear to auscultation bilaterally Breasts: normal appearance, no masses or tenderness, No nipple retraction or dimpling, No nipple discharge or bleeding, No axillary or supraclavicular adenopathy Heart: regular rate and rhythm Abdomen: soft, non-tender; no masses,  no organomegaly Extremities: extremities normal, atraumatic, no cyanosis or edema Skin: Skin color, texture, turgor normal. No rashes or lesions Lymph nodes: Cervical, supraclavicular, and axillary nodes normal. No abnormal inguinal nodes palpated Neurologic: Grossly normal  Pelvic: External genitalia:  no lesions              Urethra:  normal appearing urethra with no masses, tenderness or lesions              Bartholin's and Skene's: normal                 Vagina: normal appearing vagina with normal color and discharge, no lesions              Cervix: absent              Pap taken: no Bimanual Exam:  Uterus:  uterus absent              Adnexa: normal adnexa and no mass, fullness, tenderness                Rectovaginal: Confirms               Anus:  normal sphincter tone, no lesions  A:  Well Woman with normal exam  S/P Robiotic Hysterectomy with ovaries retained due to bleeding  Obesity  Hypertension unstable medication with PCP management    P:   Reviewed health and wellness pertinent to exam  Continue follow up as indicated  Pap smear not taken today  Mammogram yearly   counseled on breast self exam, mammography screening, STD prevention, HIV risk factors and prevention, adequate intake of calcium and vitamin D, diet and exercise and weight loss encouraged. Patient working on weight loss now.  return annually or prn  An After Visit Summary was printed and given to the patient.

## 2014-03-18 NOTE — Progress Notes (Signed)
Reviewed personally.  M. Suzanne Amin Fornwalt, MD.  

## 2014-06-27 ENCOUNTER — Encounter: Payer: 59 | Attending: Obstetrics and Gynecology | Admitting: Skilled Nursing Facility1

## 2014-06-27 DIAGNOSIS — Z6839 Body mass index (BMI) 39.0-39.9, adult: Secondary | ICD-10-CM | POA: Insufficient documentation

## 2014-06-27 DIAGNOSIS — Z713 Dietary counseling and surveillance: Secondary | ICD-10-CM | POA: Diagnosis present

## 2014-06-27 DIAGNOSIS — E669 Obesity, unspecified: Secondary | ICD-10-CM | POA: Insufficient documentation

## 2014-07-01 NOTE — Progress Notes (Signed)
Patient was seen on 06/27/2014 for the Weight Loss Class at the [facility]. The following learning objectives were met by the patient during this class:   Describe healthy choices in each food group  Describe portion size of foods  Use plate method for meal planning  Demonstrate how to read Nutrition Facts food label  Set realistic goals for weight loss, diet changes, and physical activity.   Goals:  1. Make healthy food choices in each food group.  2. Reduce portion size of foods.  3. Increase fruit and vegetable intake.  4. Use plate method for meal planning.  5. Increase physical activity.   Handouts given:  1. Weight loss tips 2. Meal plan/portion card 2. Plate method  2. Food label handout

## 2014-09-21 ENCOUNTER — Encounter: Payer: Self-pay | Admitting: Certified Nurse Midwife

## 2014-09-26 ENCOUNTER — Encounter: Payer: 59 | Attending: Obstetrics and Gynecology | Admitting: Skilled Nursing Facility1

## 2014-09-26 DIAGNOSIS — E669 Obesity, unspecified: Secondary | ICD-10-CM | POA: Insufficient documentation

## 2014-09-26 DIAGNOSIS — Z6839 Body mass index (BMI) 39.0-39.9, adult: Secondary | ICD-10-CM | POA: Diagnosis not present

## 2014-09-26 DIAGNOSIS — Z713 Dietary counseling and surveillance: Secondary | ICD-10-CM | POA: Insufficient documentation

## 2014-09-30 ENCOUNTER — Other Ambulatory Visit (HOSPITAL_BASED_OUTPATIENT_CLINIC_OR_DEPARTMENT_OTHER): Payer: Self-pay | Admitting: Family Medicine

## 2014-09-30 DIAGNOSIS — Z1231 Encounter for screening mammogram for malignant neoplasm of breast: Secondary | ICD-10-CM

## 2014-10-01 NOTE — Progress Notes (Signed)
Patient was seen on 11/07/20115 for the Weight Loss Class at the [facility]. The following learning objectives were met by the patient during this class:   Describe healthy choices in each food group  Describe portion size of foods  Use plate method for meal planning  Demonstrate how to read Nutrition Facts food label  Set realistic goals for weight loss, diet changes, and physical activity.   Goals:  1. Make healthy food choices in each food group.  2. Reduce portion size of foods.  3. Increase fruit and vegetable intake.  4. Use plate method for meal planning.  5. Increase physical activity.    Handouts given:   1. Nutrition Strategies for Weight Loss   2. Meal plan/portion card   3. MyPlate Planner   4. Weight Management Recipe Resources   5. Bake, Broil, [place]

## 2014-10-06 ENCOUNTER — Ambulatory Visit (HOSPITAL_BASED_OUTPATIENT_CLINIC_OR_DEPARTMENT_OTHER)
Admission: RE | Admit: 2014-10-06 | Discharge: 2014-10-06 | Disposition: A | Discharge status: Home / Self Care | Payer: 59 | Source: Ambulatory Visit | Attending: Family Medicine | Admitting: Family Medicine

## 2014-10-06 DIAGNOSIS — Z1231 Encounter for screening mammogram for malignant neoplasm of breast: Secondary | ICD-10-CM | POA: Diagnosis present

## 2015-03-16 ENCOUNTER — Encounter: Payer: Self-pay | Admitting: Nurse Practitioner

## 2015-03-16 ENCOUNTER — Ambulatory Visit: Payer: Commercial Managed Care - PPO | Admitting: Nurse Practitioner

## 2015-05-21 ENCOUNTER — Ambulatory Visit: Payer: Commercial Managed Care - PPO | Admitting: Nurse Practitioner

## 2015-07-08 ENCOUNTER — Encounter: Payer: Self-pay | Admitting: Certified Nurse Midwife

## 2015-09-08 ENCOUNTER — Other Ambulatory Visit (HOSPITAL_BASED_OUTPATIENT_CLINIC_OR_DEPARTMENT_OTHER): Payer: Self-pay | Admitting: Family Medicine

## 2015-09-08 DIAGNOSIS — Z1231 Encounter for screening mammogram for malignant neoplasm of breast: Secondary | ICD-10-CM

## 2015-10-12 ENCOUNTER — Ambulatory Visit (HOSPITAL_BASED_OUTPATIENT_CLINIC_OR_DEPARTMENT_OTHER): Payer: Commercial Managed Care - PPO

## 2015-11-26 ENCOUNTER — Ambulatory Visit: Payer: 59 | Admitting: Family

## 2016-01-21 DIAGNOSIS — H5213 Myopia, bilateral: Secondary | ICD-10-CM | POA: Diagnosis not present

## 2016-01-21 DIAGNOSIS — I10 Essential (primary) hypertension: Secondary | ICD-10-CM | POA: Diagnosis not present

## 2016-01-21 DIAGNOSIS — G4700 Insomnia, unspecified: Secondary | ICD-10-CM | POA: Diagnosis not present

## 2016-01-21 DIAGNOSIS — J301 Allergic rhinitis due to pollen: Secondary | ICD-10-CM | POA: Diagnosis not present

## 2016-01-21 DIAGNOSIS — H524 Presbyopia: Secondary | ICD-10-CM | POA: Diagnosis not present

## 2016-01-21 DIAGNOSIS — H52223 Regular astigmatism, bilateral: Secondary | ICD-10-CM | POA: Diagnosis not present

## 2016-01-21 MED FILL — FLUTICASONE PROP 50 MCG SPR: 50 | 30 days supply | Qty: 16 | Fill #0

## 2016-01-21 MED FILL — ALL DAY ALLERGY 10 MG TAB: 10 | 100 days supply | Qty: 100 | Fill #0

## 2016-01-21 MED FILL — traZODone HCL 50 MG TABS: 50 | 90 days supply | Qty: 90 | Fill #0

## 2016-01-21 MED FILL — LISINOPRIL-HCTZ 20-25 MG TA: 20-25 | 90 days supply | Qty: 90 | Fill #0

## 2016-01-22 ENCOUNTER — Encounter: Payer: 59 | Attending: Family | Admitting: Dietician

## 2016-01-22 DIAGNOSIS — Z713 Dietary counseling and surveillance: Secondary | ICD-10-CM | POA: Insufficient documentation

## 2016-01-22 NOTE — Progress Notes (Signed)
Patient was seen on 01/22/16 for the Weight Loss Class at the [facility]. The following learning objectives were met by the patient during this class:   Describe healthy choices in each food group  Describe portion size of foods  Use plate method for meal planning  Demonstrate how to read Nutrition Facts food label  Set realistic goals for weight loss, diet changes, and physical activity.   Goals:  1. Make healthy food choices in each food group.  2. Reduce portion size of foods.  3. Increase fruit and vegetable intake.  4. Use plate method for meal planning.  5. Increase physical activity.    Handouts given:   1. Nutrition Strategies for Weight Loss   2. Meal plan/portion card   3. MyPlate Planner   4. Weight Management Recipe Resources   5. Bake, Broil, Grill   

## 2016-01-24 DIAGNOSIS — J301 Allergic rhinitis due to pollen: Secondary | ICD-10-CM | POA: Insufficient documentation

## 2016-01-24 DIAGNOSIS — G4700 Insomnia, unspecified: Secondary | ICD-10-CM | POA: Insufficient documentation

## 2016-03-09 DIAGNOSIS — E559 Vitamin D deficiency, unspecified: Secondary | ICD-10-CM | POA: Diagnosis not present

## 2016-03-09 DIAGNOSIS — Z0000 Encounter for general adult medical examination without abnormal findings: Secondary | ICD-10-CM | POA: Diagnosis not present

## 2016-03-09 DIAGNOSIS — I10 Essential (primary) hypertension: Secondary | ICD-10-CM | POA: Diagnosis not present

## 2016-03-09 DIAGNOSIS — G4700 Insomnia, unspecified: Secondary | ICD-10-CM | POA: Diagnosis not present

## 2016-03-09 DIAGNOSIS — E669 Obesity, unspecified: Secondary | ICD-10-CM | POA: Diagnosis not present

## 2016-03-09 MED FILL — traZODone HCL 50 MG TABS: 50 | 30 days supply | Qty: 60 | Fill #0

## 2016-03-13 MED FILL — VIT D3-50 50,000 UNITS CAPS: 1.25 MG | 56 days supply | Qty: 8 | Fill #0

## 2016-04-24 MED FILL — FLUTICASONE PROP 50 MCG SPR: 50 | 30 days supply | Qty: 16 | Fill #1

## 2016-04-24 MED FILL — LISINOPRIL-HCTZ 20-25 MG TA: 20-25 | 90 days supply | Qty: 90 | Fill #1

## 2016-04-25 MED FILL — ALL DAY ALLERGY 10 MG TAB: 10 | 100 days supply | Qty: 100 | Fill #0

## 2016-06-30 DIAGNOSIS — G4700 Insomnia, unspecified: Secondary | ICD-10-CM | POA: Diagnosis not present

## 2016-06-30 DIAGNOSIS — I10 Essential (primary) hypertension: Secondary | ICD-10-CM | POA: Diagnosis not present

## 2016-06-30 MED FILL — LISINOPRIL-HCTZ 20-25 MG TA: 20-25 | 90 days supply | Qty: 90 | Fill #0

## 2016-06-30 MED FILL — traZODone HCL 100 MG TABS: 100 | 30 days supply | Qty: 30 | Fill #0

## 2016-09-01 ENCOUNTER — Other Ambulatory Visit (HOSPITAL_BASED_OUTPATIENT_CLINIC_OR_DEPARTMENT_OTHER): Payer: Self-pay | Admitting: Family Medicine

## 2016-09-01 ENCOUNTER — Other Ambulatory Visit (HOSPITAL_BASED_OUTPATIENT_CLINIC_OR_DEPARTMENT_OTHER): Payer: Self-pay | Admitting: *Deleted

## 2016-09-01 DIAGNOSIS — Z1231 Encounter for screening mammogram for malignant neoplasm of breast: Secondary | ICD-10-CM

## 2016-09-08 MED FILL — traZODone HCL 100 MG TABS: 100 | 30 days supply | Qty: 30 | Fill #1

## 2016-09-09 ENCOUNTER — Ambulatory Visit (HOSPITAL_BASED_OUTPATIENT_CLINIC_OR_DEPARTMENT_OTHER): Payer: Commercial Managed Care - PPO

## 2016-09-12 MED FILL — ALL DAY ALLERGY 10 MG TAB: 10 | 100 days supply | Qty: 100 | Fill #0

## 2016-11-07 MED FILL — traZODone HCL 100 MG TABS: 100 | 30 days supply | Qty: 30 | Fill #2

## 2016-11-07 MED FILL — LISINOPRIL-HCTZ 20-25 MG TA: 20-25 | 90 days supply | Qty: 90 | Fill #1

## 2017-01-10 ENCOUNTER — Encounter: Payer: 59 | Attending: Family | Admitting: Registered"

## 2017-01-10 DIAGNOSIS — Z713 Dietary counseling and surveillance: Secondary | ICD-10-CM | POA: Diagnosis not present

## 2017-01-10 NOTE — Progress Notes (Signed)
Patient was seen on 01/10/2017 for the Weight Loss Class at the [facility]. The following learning objectives were met by the patient during this class:   Describe healthy choices in each food group  Describe portion size of foods  Use plate method for meal planning  Demonstrate how to read Nutrition Facts food label  Set realistic goals for weight loss, diet changes, and physical activity.   Goals:  1. Make healthy food choices in each food group.  2. Reduce portion size of foods.  3. Increase fruit and vegetable intake.  4. Use plate method for meal planning.  5. Increase physical activity.    Handouts given:   1. Power Point of presentation   2. Meal plan form    3. Which Drinks have Sugar   4. Weight Management Apps and Websites   

## 2017-02-12 MED FILL — traZODone HCL 100 MG TABS: 100 | 30 days supply | Qty: 30 | Fill #3

## 2017-02-12 MED FILL — LISINOPRIL-HCTZ 20-25 MG TA: 20-25 | 90 days supply | Qty: 90 | Fill #2

## 2017-02-23 DIAGNOSIS — I10 Essential (primary) hypertension: Secondary | ICD-10-CM | POA: Diagnosis not present

## 2017-02-23 DIAGNOSIS — E559 Vitamin D deficiency, unspecified: Secondary | ICD-10-CM | POA: Diagnosis not present

## 2017-02-23 DIAGNOSIS — Z0100 Encounter for examination of eyes and vision without abnormal findings: Secondary | ICD-10-CM | POA: Diagnosis not present

## 2017-02-23 DIAGNOSIS — J3089 Other allergic rhinitis: Secondary | ICD-10-CM | POA: Diagnosis not present

## 2017-02-23 DIAGNOSIS — E669 Obesity, unspecified: Secondary | ICD-10-CM | POA: Diagnosis not present

## 2017-02-23 MED FILL — ALL DAY ALLERGY 10 MG TAB: 10 | 100 days supply | Qty: 100 | Fill #0

## 2017-03-23 ENCOUNTER — Ambulatory Visit: Payer: Self-pay | Admitting: *Deleted

## 2017-03-27 MED FILL — IBUPROFEN 800 MG TABLET: 800 | 5 days supply | Qty: 20 | Fill #0

## 2017-03-27 MED FILL — AMOXICILLIN 500 MG CAPSULE: 500 | 7 days supply | Qty: 21 | Fill #0

## 2017-04-23 DIAGNOSIS — R9431 Abnormal electrocardiogram [ECG] [EKG]: Secondary | ICD-10-CM | POA: Diagnosis not present

## 2017-04-23 DIAGNOSIS — R635 Abnormal weight gain: Secondary | ICD-10-CM | POA: Diagnosis not present

## 2017-04-23 DIAGNOSIS — I10 Essential (primary) hypertension: Secondary | ICD-10-CM | POA: Diagnosis not present

## 2017-04-27 DIAGNOSIS — R9431 Abnormal electrocardiogram [ECG] [EKG]: Secondary | ICD-10-CM | POA: Diagnosis not present

## 2017-04-30 DIAGNOSIS — R0602 Shortness of breath: Secondary | ICD-10-CM | POA: Diagnosis not present

## 2017-04-30 DIAGNOSIS — R635 Abnormal weight gain: Secondary | ICD-10-CM | POA: Diagnosis not present

## 2017-05-15 MED FILL — LISINOPRIL-HCTZ 20-25 MG TA: 20-25 | 90 days supply | Qty: 90 | Fill #0

## 2017-05-19 DIAGNOSIS — E7800 Pure hypercholesterolemia, unspecified: Secondary | ICD-10-CM | POA: Diagnosis not present

## 2017-05-19 DIAGNOSIS — E559 Vitamin D deficiency, unspecified: Secondary | ICD-10-CM | POA: Diagnosis not present

## 2017-05-19 DIAGNOSIS — R635 Abnormal weight gain: Secondary | ICD-10-CM | POA: Diagnosis not present

## 2017-05-19 DIAGNOSIS — I10 Essential (primary) hypertension: Secondary | ICD-10-CM | POA: Diagnosis not present

## 2017-06-29 DIAGNOSIS — E559 Vitamin D deficiency, unspecified: Secondary | ICD-10-CM | POA: Diagnosis not present

## 2017-06-29 DIAGNOSIS — I10 Essential (primary) hypertension: Secondary | ICD-10-CM | POA: Diagnosis not present

## 2017-06-29 DIAGNOSIS — R635 Abnormal weight gain: Secondary | ICD-10-CM | POA: Diagnosis not present

## 2017-06-29 DIAGNOSIS — E7800 Pure hypercholesterolemia, unspecified: Secondary | ICD-10-CM | POA: Diagnosis not present

## 2017-07-06 MED FILL — ALL DAY ALLERGY 10 MG TAB: 10 | 100 days supply | Qty: 100 | Fill #1

## 2017-07-31 DIAGNOSIS — E559 Vitamin D deficiency, unspecified: Secondary | ICD-10-CM | POA: Diagnosis not present

## 2017-07-31 DIAGNOSIS — R635 Abnormal weight gain: Secondary | ICD-10-CM | POA: Diagnosis not present

## 2017-07-31 DIAGNOSIS — I10 Essential (primary) hypertension: Secondary | ICD-10-CM | POA: Diagnosis not present

## 2017-07-31 DIAGNOSIS — Z79899 Other long term (current) drug therapy: Secondary | ICD-10-CM | POA: Diagnosis not present

## 2017-08-20 MED FILL — LISINOPRIL-HCTZ 20-25 MG TA: 20-25 | 90 days supply | Qty: 90 | Fill #1

## 2017-08-27 DIAGNOSIS — E559 Vitamin D deficiency, unspecified: Secondary | ICD-10-CM | POA: Diagnosis not present

## 2017-08-27 DIAGNOSIS — E7800 Pure hypercholesterolemia, unspecified: Secondary | ICD-10-CM | POA: Diagnosis not present

## 2017-08-27 DIAGNOSIS — R635 Abnormal weight gain: Secondary | ICD-10-CM | POA: Diagnosis not present

## 2017-08-27 DIAGNOSIS — I10 Essential (primary) hypertension: Secondary | ICD-10-CM | POA: Diagnosis not present

## 2017-08-30 ENCOUNTER — Ambulatory Visit (HOSPITAL_BASED_OUTPATIENT_CLINIC_OR_DEPARTMENT_OTHER)
Admission: RE | Admit: 2017-08-30 | Discharge: 2017-08-30 | Disposition: A | Discharge status: Home / Self Care | Payer: 59 | Source: Ambulatory Visit | Attending: *Deleted | Admitting: *Deleted

## 2017-08-30 ENCOUNTER — Encounter (HOSPITAL_BASED_OUTPATIENT_CLINIC_OR_DEPARTMENT_OTHER): Payer: Self-pay

## 2017-08-30 DIAGNOSIS — Z1231 Encounter for screening mammogram for malignant neoplasm of breast: Secondary | ICD-10-CM | POA: Diagnosis not present

## 2017-08-30 MED FILL — TROKENDI XR 25 MG CAPSULE: 25 | 30 days supply | Qty: 30 | Fill #0

## 2017-08-30 MED FILL — PHENTERMINE 37.5 MG TABLET: 37.5 | 30 days supply | Qty: 30 | Fill #0

## 2017-10-06 DIAGNOSIS — E559 Vitamin D deficiency, unspecified: Secondary | ICD-10-CM | POA: Diagnosis not present

## 2017-10-06 DIAGNOSIS — R635 Abnormal weight gain: Secondary | ICD-10-CM | POA: Diagnosis not present

## 2017-10-06 DIAGNOSIS — I10 Essential (primary) hypertension: Secondary | ICD-10-CM | POA: Diagnosis not present

## 2017-10-06 DIAGNOSIS — E7800 Pure hypercholesterolemia, unspecified: Secondary | ICD-10-CM | POA: Diagnosis not present

## 2017-10-08 MED FILL — PHENTERMINE 37.5 MG TABLET: 37.5 | 60 days supply | Qty: 30 | Fill #0

## 2017-10-08 MED FILL — TROKENDI XR 25 MG CAPSULE: 25 | 30 days supply | Qty: 30 | Fill #0

## 2017-11-03 DIAGNOSIS — E559 Vitamin D deficiency, unspecified: Secondary | ICD-10-CM | POA: Diagnosis not present

## 2017-11-03 DIAGNOSIS — R635 Abnormal weight gain: Secondary | ICD-10-CM | POA: Diagnosis not present

## 2017-11-03 DIAGNOSIS — I10 Essential (primary) hypertension: Secondary | ICD-10-CM | POA: Diagnosis not present

## 2017-11-03 DIAGNOSIS — E7800 Pure hypercholesterolemia, unspecified: Secondary | ICD-10-CM | POA: Diagnosis not present

## 2017-11-05 MED FILL — CLINDAMYCIN HCL 150 MG CAPS: 150 | 7 days supply | Qty: 56 | Fill #0

## 2017-11-05 MED FILL — IBUPROFEN 800 MG TAB: 800 | 5 days supply | Qty: 20 | Fill #0

## 2017-11-05 MED FILL — TROKENDI XR 25 MG CAPSULE: 25 | 30 days supply | Qty: 30 | Fill #0

## 2017-11-21 MED FILL — CETIRIZINE HCL 10 MG TABS: 10 | 100 days supply | Qty: 100 | Fill #2

## 2017-11-21 MED FILL — LISINOPRIL-HCTZ 20-25 MG TA: 20-25 | 90 days supply | Qty: 90 | Fill #2

## 2017-12-01 DIAGNOSIS — E559 Vitamin D deficiency, unspecified: Secondary | ICD-10-CM | POA: Diagnosis not present

## 2017-12-01 DIAGNOSIS — R635 Abnormal weight gain: Secondary | ICD-10-CM | POA: Diagnosis not present

## 2017-12-01 DIAGNOSIS — E7800 Pure hypercholesterolemia, unspecified: Secondary | ICD-10-CM | POA: Diagnosis not present

## 2017-12-01 DIAGNOSIS — I10 Essential (primary) hypertension: Secondary | ICD-10-CM | POA: Diagnosis not present

## 2017-12-04 MED FILL — PHENTERMINE 37.5 MG TABLET: 37.5 | 15 days supply | Qty: 15 | Fill #0

## 2017-12-05 MED FILL — TROKENDI XR 25 MG CAPSULE: 25 | 30 days supply | Qty: 30 | Fill #0

## 2018-02-02 DIAGNOSIS — R5383 Other fatigue: Secondary | ICD-10-CM | POA: Diagnosis not present

## 2018-02-02 DIAGNOSIS — E559 Vitamin D deficiency, unspecified: Secondary | ICD-10-CM | POA: Diagnosis not present

## 2018-02-02 DIAGNOSIS — E7800 Pure hypercholesterolemia, unspecified: Secondary | ICD-10-CM | POA: Diagnosis not present

## 2018-02-02 DIAGNOSIS — I10 Essential (primary) hypertension: Secondary | ICD-10-CM | POA: Diagnosis not present

## 2018-02-02 DIAGNOSIS — R635 Abnormal weight gain: Secondary | ICD-10-CM | POA: Diagnosis not present

## 2018-02-04 MED FILL — PHENTERMINE 37.5 MG TABLET: 37.5 | 45 days supply | Qty: 30 | Fill #0

## 2018-02-04 MED FILL — LISINOPRIL-HCTZ 20-12.5 TAB: 20-12.5 | 30 days supply | Qty: 30 | Fill #0

## 2018-02-04 MED FILL — TROKENDI XR 25 MG CAPSULE: 25 | 30 days supply | Qty: 30 | Fill #0

## 2018-02-22 MED FILL — CETIRIZINE HCL 10 MG TABS: 10 | 100 days supply | Qty: 100 | Fill #3

## 2018-02-25 DIAGNOSIS — J069 Acute upper respiratory infection, unspecified: Secondary | ICD-10-CM | POA: Diagnosis not present

## 2018-02-25 DIAGNOSIS — R51 Headache: Secondary | ICD-10-CM | POA: Diagnosis not present

## 2018-02-25 DIAGNOSIS — B9689 Other specified bacterial agents as the cause of diseases classified elsewhere: Secondary | ICD-10-CM | POA: Diagnosis not present

## 2018-02-25 DIAGNOSIS — R05 Cough: Secondary | ICD-10-CM | POA: Diagnosis not present

## 2018-02-25 MED FILL — AMOX-CLAV 875-125 MG TABLET: 875-125 | 7 days supply | Qty: 14 | Fill #0

## 2018-03-16 DIAGNOSIS — R635 Abnormal weight gain: Secondary | ICD-10-CM | POA: Diagnosis not present

## 2018-03-16 DIAGNOSIS — I10 Essential (primary) hypertension: Secondary | ICD-10-CM | POA: Diagnosis not present

## 2018-03-16 DIAGNOSIS — E559 Vitamin D deficiency, unspecified: Secondary | ICD-10-CM | POA: Diagnosis not present

## 2018-03-16 DIAGNOSIS — E7800 Pure hypercholesterolemia, unspecified: Secondary | ICD-10-CM | POA: Diagnosis not present

## 2018-03-18 MED FILL — LISINOPRIL-HCTZ 20-12.5 TAB: 20-12.5 | 90 days supply | Qty: 90 | Fill #0

## 2018-03-18 MED FILL — TROKENDI XR 25 MG CAPSULE: 25 | 30 days supply | Qty: 30 | Fill #0

## 2018-03-18 MED FILL — PHENTERMINE 37.5 MG TABLET: 37.5 | 60 days supply | Qty: 30 | Fill #0

## 2018-06-18 MED FILL — LISINOPRIL-HCTZ 20-12.5 TAB: 20-12.5 | 90 days supply | Qty: 90 | Fill #0

## 2018-07-16 ENCOUNTER — Encounter: Payer: Self-pay | Admitting: Family

## 2018-07-16 ENCOUNTER — Ambulatory Visit: Payer: 59 | Admitting: Family

## 2018-07-16 VITALS — BP 148/90 | HR 63 | Temp 98.9°F | Resp 16 | Ht 61.0 in | Wt 184.0 lb

## 2018-07-16 DIAGNOSIS — E559 Vitamin D deficiency, unspecified: Secondary | ICD-10-CM | POA: Diagnosis not present

## 2018-07-16 DIAGNOSIS — I10 Essential (primary) hypertension: Secondary | ICD-10-CM | POA: Diagnosis not present

## 2018-07-16 LAB — BASIC METABOLIC PANEL
BUN: 20 mg/dL (ref 6–23)
CO2: 31 mEq/L (ref 19–32)
CREATININE: 0.82 mg/dL (ref 0.40–1.20)
Calcium: 9.7 mg/dL (ref 8.4–10.5)
Chloride: 100 mEq/L (ref 96–112)
GFR: 78.43 mL/min (ref >60.00)
Glucose, Bld: 87 mg/dL (ref 70–99)
Potassium: 4.1 mEq/L (ref 3.5–5.1)
Sodium: 138 mEq/L (ref 135–145)

## 2018-07-16 MED ORDER — AMLODIPINE BESYLATE 5 MG PO TABS: 5.0000 mg | ORAL_TABLET | Freq: Every day | ORAL | 3 refills | Status: DC

## 2018-07-16 MED FILL — AMLODIPINE BESYLATE 5 MG TA: 5 | 90 days supply | Qty: 90 | Fill #0

## 2018-07-16 NOTE — Patient Instructions (Signed)
Please restart amlodipine 5mg  once daily Complete lab work prior to leaving.  Welcome to [facility]!

## 2018-07-16 NOTE — Progress Notes (Signed)
Subjective:    Patient ID: Judy Carroll, female    DOB: 07/01/1966, 52 y.o.   MRN: [id number]  HPI  Judy Carroll is a 51 yr old female who presents today to establish care.  Pmhx is significant for the following:  1) HTN-maintained on lisinopril-hctz 25mg .  Reports that she was diagnosed at age 47.  She reports good compliance with her medication.  BP Readings from Last 3 Encounters:  07/16/18 (!) 148/90  03/10/14 112/78  03/14/13 124/80   2) Dysmenorrhea/menorrhagia- had hysterectomy 2014.  3) Urinary incontinence- stress incontinence.  4) Obesity- BMI 39.5.    5) seasonal allergic rhinitis- maintained on prn zyrtec. (worse in sping)  6) vit D deficiency- Not currently on a supplement.   Review of Systems  Constitutional: Negative for unexpected weight change.  HENT: Negative for hearing loss and rhinorrhea.   Eyes: Negative for visual disturbance.  Respiratory: Negative for cough and shortness of breath.   Cardiovascular: Negative for chest pain and leg swelling.  Gastrointestinal: Negative for blood in stool, constipation and diarrhea.  Genitourinary: Negative for dysuria, frequency and hematuria.  Musculoskeletal: Negative for arthralgias and myalgias.  Skin: Negative for rash.  Neurological: Negative for headaches.  Hematological: Negative for adenopathy.  Psychiatric/Behavioral:       Denies depression/anxiety   Past Medical History:  Diagnosis Date  . Arthritis   . Chicken pox   . Dysmenorrhea   . Frequent urinary tract infections   . Menorrhagia   . Urinary incontinence      Social History   Socioeconomic History  . Marital status: Single    Spouse name: Not on file  . Number of children: 1  . Years of education: college grad  . Highest education level: Not on file  Occupational History  . Occupation: Facilities manager  . Financial resource strain: Not hard at all  . Food insecurity:    Worry: Never true    Inability: Never  true  . Transportation needs:    Medical: No    Non-medical: No  Tobacco Use  . Smoking status: Never Smoker  . Smokeless tobacco: Never Used  Substance and Sexual Activity  . Alcohol use: Yes    Comment: rarely 3 a month  . Drug use: No  . Sexual activity: Yes    Partners: Male    Birth control/protection: Surgical    Comment: robotic hysterectomy  Lifestyle  . Physical activity:    Days per week: 3 days    Minutes per session: 30 min  . Stress: Not on file  Relationships  . Social connections:    Talks on phone: Never    Gets together: Once a week    Attends religious service: More than 4 times per year    Active member of club or organization: No    Attends meetings of clubs or organizations: Never    Relationship status: Not on file  . Intimate partner violence:    Fear of current or ex partner: Not on file    Emotionally abused: Not on file    Physically abused: Not on file    Forced sexual activity: Not on file  Other Topics Concern  . 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     Past Surgical  History:  Procedure Laterality Date  . BILATERAL SALPINGECTOMY Bilateral 02/17/2013   salpingectomy- unilateral 2014  . BUNIONECTOMY Right 2014  . CYSTOSCOPY N/A 02/17/2013   Procedure: CYSTOSCOPY;  Surgeon: Peri Maris, MD;  Location: [facility];  Service: Gynecology;  Laterality: N/A;  . NO PAST SURGERIES    . ROBOTIC ASSISTED TOTAL HYSTERECTOMY N/A 02/17/2013   Procedure: ROBOTIC ASSISTED TOTAL HYSTERECTOMY;  Surgeon: Peri Maris, MD;  Location: [facility];  Service: Gynecology;  Laterality: N/A;  . WISDOM TOOTH EXTRACTION      Family History  Problem Relation Age of Onset  . Heart failure Mother        chf  . Hypertension Mother   . Diabetes Mother   . Heart disease Mother     . Hyperlipidemia Mother   . COPD Mother   . Depression Mother   . Drug abuse Mother   . Hypertension Father   . Hypertension Sister   . COPD Paternal Grandmother   . Cancer Paternal Grandmother   . Diabetes Paternal Grandmother   . Heart attack Paternal Grandmother   . Heart disease Paternal Grandmother   . Hyperlipidemia Paternal Grandmother     No Known Allergies  Current Outpatient Medications on File Prior to Visit  Medication Sig Dispense Refill  . cetirizine (ZYRTEC) 10 MG tablet TAKE 1 TABLET BY MOUTH DAILY    . ibuprofen (ADVIL,MOTRIN) 200 MG tablet Take 200 mg by mouth as needed for pain (liquid gels).    Marland Kitchen lisinopril-hydrochlorothiazide (PRINZIDE,ZESTORETIC) 20-25 MG per tablet Take 1 tablet by mouth daily.    . Multiple Vitamins-Minerals (HAIR/SKIN/NAILS PO) Take by mouth daily.     No current facility-administered medications on file prior to visit.     BP (!) 148/90   Pulse 63   Temp 98.9 F (37.2 C) (Oral)   Resp 16   Ht 5\' 1"  (1.549 m)   Wt 184 lb (83.5 kg)   LMP 01/27/2013   SpO2 100%   BMI 34.77 kg/m       Objective:   Physical Exam  Constitutional: She is oriented to person, place, and time. She appears well-developed and well-nourished.  HENT:  Head: Normocephalic and atraumatic.  Eyes: Right eye exhibits no discharge. No scleral icterus.  Neck: Neck supple. No JVD present.  Cardiovascular: Normal rate, regular rhythm and normal heart sounds.  No murmur heard. Pulmonary/Chest: Effort normal and breath sounds normal. No respiratory distress. She has no wheezes.  Musculoskeletal: She exhibits no edema.  Lymphadenopathy:    She has no cervical adenopathy.  Neurological: She is alert and oriented to person, place, and time.  Skin: Skin is warm and dry.  Psychiatric: She has a normal mood and affect. Her behavior is normal. Judgment and thought content normal.          Assessment & Plan:  HTN- BP uncontrolled.  Will add amlodipine 5mg .  She has been on this in the past.  Vit D deficiency- not on supplement. Obtain follow up Vit D level.   Stress incontinence- mild. Will monitor for now.

## 2018-07-18 ENCOUNTER — Encounter: Payer: Self-pay | Admitting: Family

## 2018-07-18 LAB — VITAMIN D 1,25 DIHYDROXY
Vitamin D 1, 25 (OH)2 Total: 55 pg/mL (ref 18–72)
Vitamin D2 1, 25 (OH)2: <8 pg/mL
Vitamin D3 1, 25 (OH)2: 55 pg/mL

## 2018-08-23 ENCOUNTER — Encounter: Payer: Self-pay | Admitting: Family

## 2018-08-23 ENCOUNTER — Ambulatory Visit (INDEPENDENT_AMBULATORY_CARE_PROVIDER_SITE_OTHER): Payer: 59 | Admitting: Family

## 2018-08-23 VITALS — BP 136/81 | HR 67 | Temp 98.6°F | Resp 16 | Ht 61.0 in | Wt 189.0 lb

## 2018-08-23 DIAGNOSIS — R9431 Abnormal electrocardiogram [ECG] [EKG]: Secondary | ICD-10-CM

## 2018-08-23 DIAGNOSIS — Z23 Encounter for immunization: Secondary | ICD-10-CM | POA: Diagnosis not present

## 2018-08-23 DIAGNOSIS — I10 Essential (primary) hypertension: Secondary | ICD-10-CM | POA: Diagnosis not present

## 2018-08-23 DIAGNOSIS — Z0000 Encounter for general adult medical examination without abnormal findings: Secondary | ICD-10-CM

## 2018-08-23 DIAGNOSIS — Z0001 Encounter for general adult medical examination with abnormal findings: Secondary | ICD-10-CM

## 2018-08-23 LAB — HEPATIC FUNCTION PANEL
ALBUMIN: 4.3 g/dL (ref 3.5–5.2)
ALK PHOS: 47 U/L (ref 39–117)
ALT: 14 U/L (ref 0–35)
AST: 14 U/L (ref 0–37)
BILIRUBIN TOTAL: 0.3 mg/dL (ref 0.2–1.2)
Bilirubin, Direct: 0.1 mg/dL (ref 0.0–0.3)
Total Protein: 7 g/dL (ref 6.0–8.3)

## 2018-08-23 LAB — CBC WITH DIFFERENTIAL/PLATELET
BASOS ABS: 0 10*3/uL (ref 0.0–0.1)
Basophils Relative: 0.6 % (ref 0.0–3.0)
Eosinophils Absolute: 0.2 10*3/uL (ref 0.0–0.7)
Eosinophils Relative: 2 % (ref 0.0–5.0)
HCT: 39.4 % (ref 36.0–46.0)
HEMOGLOBIN: 12.9 g/dL (ref 12.0–15.0)
LYMPHS ABS: 2.3 10*3/uL (ref 0.7–4.0)
Lymphocytes Relative: 28.2 % (ref 12.0–46.0)
MCHC: 32.8 g/dL (ref 30.0–36.0)
MCV: 93.1 fl (ref 78.0–100.0)
Monocytes Absolute: 0.8 10*3/uL (ref 0.1–1.0)
Monocytes Relative: 10.1 % (ref 3.0–12.0)
NEUTROS PCT: 59.1 % (ref 43.0–77.0)
Neutro Abs: 4.8 10*3/uL (ref 1.4–7.7)
Platelets: 332 10*3/uL (ref 150.0–400.0)
RBC: 4.24 Mil/uL (ref 3.87–5.11)
RDW: 13.7 % (ref 11.5–15.5)
WBC: 8.1 10*3/uL (ref 4.0–10.5)

## 2018-08-23 LAB — URINALYSIS, ROUTINE W REFLEX MICROSCOPIC
BILIRUBIN URINE: NEGATIVE
Glucose, UA: NEGATIVE
HGB URINE DIPSTICK: NEGATIVE
Ketones, ur: NEGATIVE
Leukocytes, UA: NEGATIVE
Nitrite: NEGATIVE
PROTEIN: NEGATIVE
Specific Gravity, Urine: 1.017 (ref 1.001–1.03)
pH: 7.5 (ref 5.0–8.0)

## 2018-08-23 LAB — LIPID PANEL
Cholesterol: 190 mg/dL (ref 0–200)
HDL: 64.8 mg/dL (ref >39.00)
LDL CALC: 112 mg/dL — AB (ref 0–99)
NONHDL: 125.65
Total CHOL/HDL Ratio: 3
Triglycerides: 67 mg/dL (ref 0.0–149.0)
VLDL: 13.4 mg/dL (ref 0.0–40.0)

## 2018-08-23 LAB — TSH: TSH: 1.01 u[IU]/mL (ref 0.35–4.50)

## 2018-08-23 MED ORDER — AMLODIPINE BESYLATE 5 MG PO TABS: 5.0000 mg | ORAL_TABLET | Freq: Every day | ORAL | 0 refills | Status: DC

## 2018-08-23 MED ORDER — LISINOPRIL-HYDROCHLOROTHIAZIDE 20-25 MG PO TABS: 1.0000 | ORAL_TABLET | Freq: Every day | ORAL | 1 refills | Status: DC

## 2018-08-23 MED FILL — LISINOPRIL-HCTZ 20-25 MG TA: 20-25 | 90 days supply | Qty: 90 | Fill #0

## 2018-08-23 NOTE — Patient Instructions (Addendum)
Please complete lab work prior to leaving. Continue to work on [employer], exercise and weight loss. You will be contacted about your referral to cardiology.

## 2018-08-23 NOTE — Progress Notes (Signed)
Subjective:    Patient ID: Judy Carroll, female    DOB: 1966/06/26, 52 y.o.   MRN: [id number]  HPI  Patient presents today for complete physical.  Immunizations: tetanus 2012, flu shot today. Diet: trying  Exercise: walks 3-4 days a week 30 minutes Wt Readings from Last 3 Encounters:  08/23/18 189 lb (85.7 kg)  07/16/18 184 lb (83.5 kg)  03/10/14 206 lb (93.4 kg)  Pap Smear: hysterectomy Mammogram: 08/30/17 Colonoscopy: due  Hypertension-last visit her blood pressure was noted to be elevated.  We added amlodipine to her regimen.  BP Readings from Last 3 Encounters:  08/23/18 136/81  07/16/18 (!) 148/90  03/10/14 112/78       Review of Systems  Constitutional: Negative for unexpected weight change.  HENT: Negative for hearing loss and rhinorrhea.   Eyes: Negative for visual disturbance.  Respiratory: Negative for cough.   Cardiovascular: Positive for leg swelling.  Gastrointestinal: Negative for blood in stool, constipation and diarrhea.  Genitourinary: Negative for dysuria, frequency and hematuria.  Musculoskeletal: Negative for arthralgias and myalgias.  Skin: Negative for rash.  Neurological: Negative for headaches.  Hematological: Negative for adenopathy.  Psychiatric/Behavioral:       Denies depression/anxiety         Past Medical History:  Diagnosis Date  . Arthritis   . Chicken pox   . Dysmenorrhea   . Frequent urinary tract infections   . Menorrhagia   . Urinary incontinence      Social History   Socioeconomic History  . Marital status: Single    Spouse name: Not on file  . Number of children: 1  . Years of education: college grad  . Highest education level: Not on file  Occupational History  . Occupation: Facilities manager  . Financial resource strain: Not hard at all  . Food insecurity:    Worry: Never true    Inability: Never true  . Transportation needs:    Medical: No    Non-medical: No  Tobacco Use  . Smoking  status: Never Smoker  . Smokeless tobacco: Never Used  Substance and Sexual Activity  . Alcohol use: Yes    Comment: rarely 3 a month  . Drug use: No  . Sexual activity: Yes    Partners: Male    Birth control/protection: Surgical    Comment: robotic hysterectomy  Lifestyle  . Physical activity:    Days per week: 3 days    Minutes per session: 30 min  . Stress: Not on file  Relationships  . Social connections:    Talks on phone: Never    Gets together: Once a week    Attends religious service: More than 4 times per year    Active member of club or organization: No    Attends meetings of clubs or organizations: Never    Relationship status: Not on file  . Intimate partner violence:    Fear of current or ex partner: Not on file    Emotionally abused: Not on file    Physically abused: Not on file    Forced sexual activity: Not on file  Other Topics Concern  . 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     Past Surgical History:  Procedure Laterality Date  . BILATERAL SALPINGECTOMY  Bilateral 02/17/2013   salpingectomy- unilateral 2014  . BUNIONECTOMY Right 2014  . CYSTOSCOPY N/A 02/17/2013   Procedure: CYSTOSCOPY;  Surgeon: Peri Maris, MD;  Location: [facility];  Service: Gynecology;  Laterality: N/A;  . NO PAST SURGERIES    . ROBOTIC ASSISTED TOTAL HYSTERECTOMY N/A 02/17/2013   Procedure: ROBOTIC ASSISTED TOTAL HYSTERECTOMY;  Surgeon: Peri Maris, MD;  Location: [facility];  Service: Gynecology;  Laterality: N/A;  . WISDOM TOOTH EXTRACTION      Family History  Problem Relation Age of Onset  . Heart failure Mother        chf  . Hypertension Mother   . Diabetes Mother   . Heart disease Mother   . Hyperlipidemia Mother   . COPD Mother   . Depression Mother   . Drug abuse Mother   .  Hypertension Father   . Hypertension Sister   . COPD Paternal Grandmother   . Cancer Paternal Grandmother   . Diabetes Paternal Grandmother   . Heart attack Paternal Grandmother   . Heart disease Paternal Grandmother   . Hyperlipidemia Paternal Grandmother     No Known Allergies  Current Outpatient Medications on File Prior to Visit  Medication Sig Dispense Refill  . amLODipine (NORVASC) 5 MG tablet Take 1 tablet (5 mg total) by mouth daily. 30 tablet 3  . cetirizine (ZYRTEC) 10 MG tablet TAKE 1 TABLET BY MOUTH DAILY    . ibuprofen (ADVIL,MOTRIN) 200 MG tablet Take 200 mg by mouth as needed for pain (liquid gels).    Marland Kitchen lisinopril-hydrochlorothiazide (PRINZIDE,ZESTORETIC) 20-25 MG per tablet Take 1 tablet by mouth daily.    . Multiple Vitamins-Minerals (HAIR/SKIN/NAILS PO) Take by mouth daily.     No current facility-administered medications on file prior to visit.     BP 136/81 (BP Location: Right Arm, Patient Position: Sitting, Cuff Size: Large)   Pulse 67   Temp 98.6 F (37 C) (Oral)   Resp 16   Ht 5\' 1"  (1.549 m)   Wt 189 lb (85.7 kg)   LMP 01/27/2013   SpO2 100%   BMI 35.71 kg/m    Objective:   Physical Exam  Physical Exam  Constitutional: She is oriented to person, place, and time. She appears well-developed and well-nourished. No distress.  HENT:  Head: Normocephalic and atraumatic.  Right Ear: Tympanic membrane and ear canal normal.  Left Ear: Tympanic membrane and ear canal normal.  Mouth/Throat: Oropharynx is clear and moist.  Eyes: Pupils are equal, round, and reactive to light. No scleral icterus.  Neck: Normal range of motion. No thyromegaly present.  Cardiovascular: Normal rate and regular rhythm.   No murmur heard. Pulmonary/Chest: Effort normal and breath sounds normal. No respiratory distress. He has no wheezes. She has no rales. She exhibits no tenderness.  Abdominal: Soft. Bowel sounds are normal. She exhibits no distension and no mass. There is no  tenderness. There is no rebound and no guarding.  Musculoskeletal: She exhibits 2+ pedal edema bilaterally Lymphadenopathy:    She has no cervical adenopathy.  Neurological: She is alert and oriented to person, place, and time. She has normal patellar reflexes. She exhibits normal muscle tone. Coordination normal.  Skin: Skin is warm and dry.  Psychiatric: She has a normal mood and affect. Her behavior is normal. Judgment and thought content normal.  Breasts: Examined lying Right: Without masses, retractions, discharge or axillary adenopathy.  Left: Without masses, retractions, discharge or axillary adenopathy.  Pelvic: deferred  Assessment & Plan:    Preventative care-discussed diet/exercise and weight loss. Refer for mammo/colo. Flu shot today.  Obtain routine lab work flu shot today, tetanus up to date. No need for pap due to hysterectomy.  Abnormal EKG- EKG tracing is personally reviewed.  EKG notes NSR with TWchanges in lateral leads.  Mother had hx of heart disease. Will refer to cardiology for further evaluation. Patient is agreeable to plan.     HTN- bp is improved. Continue current regimen.  Assessment & Plan:

## 2018-08-23 NOTE — Addendum Note (Signed)
Addended by: Caffie Pinto on: 08/23/2018 02:49 PM   Modules accepted: Orders

## 2018-08-31 ENCOUNTER — Ambulatory Visit (HOSPITAL_BASED_OUTPATIENT_CLINIC_OR_DEPARTMENT_OTHER)
Admission: RE | Admit: 2018-08-31 | Discharge: 2018-08-31 | Disposition: A | Discharge status: Home / Self Care | Payer: 59 | Source: Ambulatory Visit | Attending: Family | Admitting: Family

## 2018-08-31 DIAGNOSIS — Z0000 Encounter for general adult medical examination without abnormal findings: Secondary | ICD-10-CM | POA: Insufficient documentation

## 2018-08-31 DIAGNOSIS — Z1231 Encounter for screening mammogram for malignant neoplasm of breast: Secondary | ICD-10-CM | POA: Diagnosis not present

## 2018-09-18 ENCOUNTER — Ambulatory Visit: Payer: 59 | Admitting: Cardiology

## 2018-10-10 ENCOUNTER — Ambulatory Visit: Payer: 59 | Admitting: Internal Medicine

## 2018-10-14 MED FILL — AMLODIPINE BESYLATE 5 MG TA: 5 | 30 days supply | Qty: 30 | Fill #1

## 2018-11-16 DIAGNOSIS — Z0100 Encounter for examination of eyes and vision without abnormal findings: Secondary | ICD-10-CM | POA: Diagnosis not present

## 2018-11-28 ENCOUNTER — Other Ambulatory Visit: Payer: Self-pay | Admitting: Family

## 2018-11-28 ENCOUNTER — Encounter: Payer: Self-pay | Admitting: Family

## 2018-11-28 MED ORDER — AMLODIPINE BESYLATE 5 MG PO TABS: 5.0000 mg | ORAL_TABLET | Freq: Every day | ORAL | 1 refills | Status: DC

## 2018-11-28 MED FILL — LISINOPRIL-HCTZ 20-25 MG TA: 20-25 | 90 days supply | Qty: 90 | Fill #1

## 2018-11-28 MED FILL — AMLODIPINE BESYLATE 5 MG TA: 5 | 90 days supply | Qty: 90 | Fill #0

## 2018-12-19 ENCOUNTER — Encounter: Payer: Self-pay | Admitting: Internal Medicine

## 2018-12-19 ENCOUNTER — Ambulatory Visit: Payer: 59 | Admitting: Internal Medicine

## 2018-12-19 VITALS — BP 142/88 | HR 55 | Ht 61.0 in | Wt 194.1 lb

## 2018-12-19 DIAGNOSIS — I10 Essential (primary) hypertension: Secondary | ICD-10-CM | POA: Diagnosis not present

## 2018-12-19 NOTE — Patient Instructions (Signed)
Medication Instructions:  Your physician recommends that you continue on your current medications as directed. Please refer to the Current Medication list given to you today.  If you need a refill on your cardiac medications before your next appointment, please call your pharmacy.   Lab work: None ordered If you have labs (blood work) drawn today and your tests are completely normal, you will receive your results only by: Marland Kitchen MyChart Message (if you have MyChart) OR . A paper copy in the mail If you have any lab test that is abnormal or we need to change your treatment, we will call you to review the results.  Testing/Procedures: Non ordered  Follow-Up: At [facility], you and your health needs are our priority.  As part of our continuing mission to provide you with exceptional heart care, we have created designated Provider Care Teams.  These Care Teams include your primary Cardiologist (physician) and Advanced Practice Providers (APPs -  Physician Assistants and Nurse Practitioners) who all work together to provide you with the care you need, when you need it. You will need a follow up appointment in 6 months.  Please call our office 2 months in advance to schedule this appointment.  You may see  Dr.Acharya or one of the following Advanced Practice Providers on your designated Care Team:   Rosaria Ferries, PA-C . Jory Sims, DNP, ANP

## 2018-12-19 NOTE — Progress Notes (Signed)
Cardiology Office Note:    Date:  12/19/2018   ID:  Judy Carroll, DOB 1966/08/03, MRN [id number]  PCP:  Debbrah Alar, NP  Cardiologist:  No primary care provider on file.  Electrophysiologist:  None   Referring: Debbrah Alar, NP   Abnormal ECG  History of Present Illness:    Judy Carroll is a 53 y.o. female with a hx of hypertension who presents today at the request of her primary care provider for an abnormal electrocardiogram.  She was seen for her annual physical in October, and was noted on electrocardiogram to have nonspecific T wave abnormalities.  I have independently reviewed this electrocardiogram and note nonspecific T wave flattening in lead III and possibly V6.  Ms. Hench tells me that she is asymptomatic, feels well, and has no concerns regarding her cardiovascular health.  Her family history is significant for her mother with congestive heart failure.  She walks on a daily basis for 10 to 15 minutes twice a day.  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  She gets up at 10 AM and 3 PM from her desk to walk around the neighborhood for 10 to 15 minutes at a time.  She is able to do so without chest discomfort or shortness of breath.  She notes room for improvement in her diet, but is actively making an effort to improve her health.  She is on 2 medications for hypertension one being a combo pill of lisinopril hydrochlorothiazide and the other being amlodipine.  She does have mild pedal edema from amlodipine which is mildly bothersome to her, however she is not inclined to change her medications today since she is not particularly bothered.  She also has mild hyperlipidemia with an LDL of 112.  She would like to pursue diet and lifestyle modification for this and has not been started on cholesterol therapy.  The patient denies chest pain, chest pressure, dyspnea at rest or with exertion, palpitations, PND,  orthopnea, or leg swelling. Denies syncope or presyncope. Denies dizziness or lightheadedness. Denies snoring and has not be evaluated for sleep apnea.  Past Medical History:  Diagnosis Date  . Arthritis   . Chicken pox   . Dysmenorrhea   . Frequent urinary tract infections   . Menorrhagia   . Urinary incontinence     Past Surgical History:  Procedure Laterality Date  . BILATERAL SALPINGECTOMY Bilateral 02/17/2013   salpingectomy- unilateral 2014  . BUNIONECTOMY Right 2014  . CYSTOSCOPY N/A 02/17/2013   Procedure: CYSTOSCOPY;  Surgeon: Peri Maris, MD;  Location: [facility];  Service: Gynecology;  Laterality: N/A;  . NO PAST SURGERIES    . ROBOTIC ASSISTED TOTAL HYSTERECTOMY N/A 02/17/2013   Procedure: ROBOTIC ASSISTED TOTAL HYSTERECTOMY;  Surgeon: Peri Maris, MD;  Location: [facility];  Service: Gynecology;  Laterality: N/A;  . WISDOM TOOTH EXTRACTION      Current Medications: Current Meds  Medication Sig  . amLODipine (NORVASC) 5 MG tablet Take 1 tablet (5 mg total) by mouth daily.  . cetirizine (ZYRTEC) 10 MG tablet Take 10 mg by mouth daily as needed.   Marland Kitchen ibuprofen (ADVIL,MOTRIN) 200 MG tablet Take 200 mg by mouth as needed for pain (liquid gels).  Marland Kitchen lisinopril-hydrochlorothiazide (PRINZIDE,ZESTORETIC) 20-25 MG tablet Take 1 tablet by mouth daily.  . Multiple Vitamins-Minerals (HAIR/SKIN/NAILS PO) Take by mouth daily.     Allergies:   Patient has no known allergies.   Social History  Socioeconomic History  . Marital status: Single    Spouse name: Not on file  . Number of children: 1  . Years of education: college grad  . Highest education level: Not on file  Occupational History  . Occupation: Facilities manager  . Financial resource strain: Not hard at all  . Food insecurity:    Worry: Never true    Inability: Never true  . Transportation needs:    Medical: No    Non-medical: No  Tobacco Use  . Smoking status: Never Smoker  . Smokeless  tobacco: Never Used  Substance and Sexual Activity  . Alcohol use: Yes    Comment: rarely 3 a month  . Drug use: No  . Sexual activity: Yes    Partners: Male    Birth control/protection: Surgical    Comment: robotic hysterectomy  Lifestyle  . Physical activity:    Days per week: 3 days    Minutes per session: 30 min  . Stress: Not on file  Relationships  . Social connections:    Talks on phone: Never    Gets together: Once a week    Attends religious service: More than 4 times per year    Active member of club or organization: No    Attends meetings of clubs or organizations: Never    Relationship status: Not on file  Other Topics Concern  . 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      Family History: The patient's family history includes COPD in her mother and paternal grandmother; Cancer in her paternal grandmother; Depression in her mother; Diabetes in her mother and paternal grandmother; Drug abuse in her mother; Heart attack in her paternal grandmother; Heart disease in her mother and paternal grandmother; Heart failure in her mother; Hyperlipidemia in her mother and paternal grandmother; Hypertension in her father, mother, and sister.  ROS:   Please see the history of present illness.    All other systems reviewed and are negative.  EKGs/Labs/Other Studies Reviewed:    The following studies were reviewed today:  EKG: Sinus bradycardia, ventricular rate 55 bpm  Recent Labs: 07/16/2018: BUN 20; Creatinine, Ser 0.82; Potassium 4.1; Sodium 138 08/23/2018: ALT 14; Hemoglobin 12.9; Platelets 332.0; TSH 1.01  Recent Lipid Panel    Component Value Date/Time   CHOL 190 08/23/2018 1400   TRIG 67.0 08/23/2018 1400   HDL 64.80 08/23/2018 1400   CHOLHDL 3 08/23/2018 1400   VLDL 13.4  08/23/2018 1400   LDLCALC 112 (H) 08/23/2018 1400    Physical Exam:    VS:  BP (!) 142/88   Pulse (!) 55   Ht 5\' 1"  (1.549 m)   Wt 194 lb 1.3 oz (88 kg)   LMP 01/27/2013   BMI 36.67 kg/m     Wt Readings from Last 3 Encounters:  12/19/18 194 lb 1.3 oz (88 kg)  08/23/18 189 lb (85.7 kg)  07/16/18 184 lb (83.5 kg)     Constitutional: No acute distress Eyes: pupils equally round and reactive to light, sclera non-icteric, normal conjunctiva and lids ENMT: normal dentition, moist mucous membranes Cardiovascular: regular rhythm, normal rate, no murmurs. S1 and S2 normal. Radial pulses normal bilaterally. No jugular venous distention.  Respiratory: clear to auscultation bilaterally GI : normal bowel sounds,  soft and nontender. No distention.   MSK: extremities warm, well perfused. No edema.  NEURO: grossly nonfocal exam, moves all extremities. PSYCH: alert and oriented x 3, normal mood and affect.   ASSESSMENT:    1. Essential hypertension    PLAN:    Ms. Goslin was referred for abnormal electrocardiogram, however today her electrocardiogram is within the normal limits.  There is a low voltage T wave in V2 and V6, otherwise there are no significant T wave abnormalities on ECG.  These are nonspecific, and the patient is asymptomatic.  Hypertension- her blood pressure is mildly elevated today, however we discussed diet lifestyle modification including modest weight loss for the improvement of blood pressure.  She would like to pursue more aggressive lifestyle modification including dietary modifications and increased aerobic activity.  I will see her back in 6 months, at which time if her blood pressure remains elevated we should certainly consider modifying her medication therapy.  I would be in favor of discontinuing amlodipine given her pedal edema switching to possibly spironolactone for additional antihypertensive therapy, given that we can titrate that dose over time as needed.   We will discuss this again at our next visit.  I have encouraged the patient to contact the office with any questions or concerns regarding her cardiovascular health, and to call us if she would like to be seen sooner than 6 months.  Medication Adjustments/Labs and Tests Ordered: Current medicines are reviewed at length with the patient today.  Concerns regarding medicines are outlined above.  Orders Placed This Encounter  Procedures  . EKG 12-Lead   No orders of the defined types were placed in this encounter.   Patient Instructions  Medication Instructions:  Your physician recommends that you continue on your current medications as directed. Please refer to the Current Medication list given to you today.  If you need a refill on your cardiac medications before your next appointment, please call your pharmacy.   Lab work: None ordered If you have labs (blood work) drawn today and your tests are completely normal, you will receive your results only by: Marland Kitchen MyChart Message (if you have MyChart) OR . A paper copy in the mail If you have any lab test that is abnormal or we need to change your treatment, we will call you to review the results.  Testing/Procedures: Non ordered  Follow-Up: At [facility], you and your health needs are our priority.  As part of our continuing mission to provide you with exceptional heart care, we have created designated Provider Care Teams.  These Care Teams include your primary Cardiologist (physician) and Advanced Practice Providers (APPs -  Physician Assistants and Nurse Practitioners) who all work together to provide you with the care you need, when you need it. You will need a follow up appointment in 6 months.  Please call our office 2 months in advance to schedule this appointment.  You may see  Dr.Destany Severns or one of the following Advanced Practice Providers on your designated Care Team:   Rosaria Ferries, PA-C . Jory Sims, DNP, ANP          Signed, Elouise Munroe, MD  12/19/2018 8:44 AM    [place]

## 2019-02-11 ENCOUNTER — Other Ambulatory Visit: Payer: Self-pay | Admitting: Family

## 2019-02-11 MED FILL — AMLODIPINE BESYLATE 5 MG TA: 5 | 90 days supply | Qty: 90 | Fill #0

## 2019-02-18 ENCOUNTER — Telehealth: Payer: Self-pay

## 2019-02-18 NOTE — Telephone Encounter (Signed)
Copied from [place] [id number]. Topic: General - Other >> Feb 18, 2019  9:05 AM Antonieta Iba C wrote: Reason for CRM: pt has an apt with PCP on 02/21/19, pt says that she doesn't want to come in to the office and would like to have web visit if possible.   Please assist/advise.   CB: [phone]- [phone]

## 2019-02-20 NOTE — Telephone Encounter (Signed)
PT has called again and states that she did not hear back but does def not want to come in [place] she is willing to do a telephone not virtual visit.  She ask that her in office be canceled. Please reach back out to follow up on appt.

## 2019-02-20 NOTE — Telephone Encounter (Signed)
Returned patients call. States she called this morning to make sure her appointment for tomorrow was cancelled because she did not want to come in office. Advised patient that we were not seeing patients in office at this time and were doing telephone visits and Web visits. States she would rather call back later if she needs appointment because she has her medications and does'nt really need anything at this time.

## 2019-02-20 NOTE — Telephone Encounter (Signed)
Appt. Was cancelled

## 2019-02-20 NOTE — Telephone Encounter (Signed)
Please cancel virtual visit.

## 2019-02-21 ENCOUNTER — Ambulatory Visit: Payer: 59 | Admitting: Family

## 2019-02-27 ENCOUNTER — Encounter: Payer: Self-pay | Admitting: Family

## 2019-02-27 MED ORDER — CETIRIZINE HCL 10 MG PO TABS: 10.0000 mg | ORAL_TABLET | Freq: Every day | ORAL | 5 refills | Status: AC | PRN

## 2019-02-27 MED FILL — LISINOPRIL-HCTZ 20-25 MG TA: 20-25 | 90 days supply | Qty: 90 | Fill #0

## 2019-06-10 ENCOUNTER — Other Ambulatory Visit: Payer: Self-pay | Admitting: Family

## 2019-06-10 MED FILL — AMLODIPINE BESYLATE 5 MG TA: 5 | 90 days supply | Qty: 90 | Fill #0

## 2019-06-10 MED FILL — CETIRIZINE HCL 10 MG TABS: 10 | 100 days supply | Qty: 100 | Fill #0

## 2019-06-11 MED FILL — LISINOPRIL-HCTZ 20-25 MG TA: 20-25 | 90 days supply | Qty: 90 | Fill #0

## 2019-06-16 ENCOUNTER — Other Ambulatory Visit: Payer: Self-pay

## 2019-06-16 ENCOUNTER — Ambulatory Visit (INDEPENDENT_AMBULATORY_CARE_PROVIDER_SITE_OTHER): Payer: 59 | Admitting: Family

## 2019-06-16 ENCOUNTER — Telehealth: Payer: Self-pay | Admitting: Family

## 2019-06-16 DIAGNOSIS — R41840 Attention and concentration deficit: Secondary | ICD-10-CM

## 2019-06-16 DIAGNOSIS — I10 Essential (primary) hypertension: Secondary | ICD-10-CM | POA: Diagnosis not present

## 2019-06-16 NOTE — Progress Notes (Signed)
Virtual Visit via Video Note  I connected with Judy Carroll on 06/16/19 at 12:40 PM EDT by a video enabled telemedicine application and verified that I am speaking with the correct person using two identifiers.  Location: Patient: home Provider: home   I discussed the limitations of evaluation and management by telemedicine and the availability of in person appointments. The patient expressed understanding and agreed to proceed.  History of Present Illness:  Patient is a 53 yr old female who presents today to discuss concern about possible ADHD diagnosis.  Reports that she recently started classes at [employer] and is having trouble concentrating.    Reports + mood swings.  Notes that when she was in the office she had trouble staying in her seat. She reports that as a child she did not really have issues with her grades as a child. Not sure if there is a family history of adhd. Reports that she procrastinates until the last minute and gets extremely agitated.  She denies panic attacks.   She reports that she gets "extremely agitated if something isn't going like it should be." Other days she does not get bothered at all.    HTN- current bp medications include amlodipine 5mg .  123/82 last night. She does report some LE edema but denies SOB.     Observations/Objective:   Gen: Awake, alert, no acute distress Resp: Breathing is even and non-labored Psych: calm/pleasant demeanor Neuro: Alert and Oriented x 3, + facial symmetry, speech is clear.   Assessment and Plan:  HTN- bp stable per home reading. Continue amlodipine. She does not find the LE to be too bothersome.    Attention/concentration deficit- new. will refer for formal ADHD evaluation. Further recommendations following review of evaluation findings.  Follow Up Instructions:    I discussed the assessment and treatment plan with the patient. The patient was provided an opportunity to ask questions and all were answered. The  patient agreed with the plan and demonstrated an understanding of the instructions.   The patient was advised to call back or seek an in-person evaluation if the symptoms worsen or if the condition fails to improve as anticipated.  Nance Pear, NP

## 2019-06-16 NOTE — Telephone Encounter (Signed)
Return in about 2 months (around 08/26/2019) for annual physical. Called left msg

## 2019-09-10 ENCOUNTER — Other Ambulatory Visit (HOSPITAL_BASED_OUTPATIENT_CLINIC_OR_DEPARTMENT_OTHER): Payer: Self-pay | Admitting: Family

## 2019-09-10 DIAGNOSIS — Z1231 Encounter for screening mammogram for malignant neoplasm of breast: Secondary | ICD-10-CM

## 2019-09-18 ENCOUNTER — Other Ambulatory Visit: Payer: Self-pay | Admitting: Family

## 2019-09-18 MED FILL — LISINOPRIL-HCTZ 20-25 MG TA: 20-25 | 30 days supply | Qty: 30 | Fill #0

## 2019-09-18 MED FILL — CETIRIZINE HCL 10 MG TABS: 10 | 100 days supply | Qty: 100 | Fill #1

## 2019-09-18 MED FILL — AMLODIPINE BESYLATE 5 MG TA: 5 | 30 days supply | Qty: 30 | Fill #0

## 2019-09-19 ENCOUNTER — Other Ambulatory Visit: Payer: Self-pay

## 2019-09-19 ENCOUNTER — Ambulatory Visit (HOSPITAL_BASED_OUTPATIENT_CLINIC_OR_DEPARTMENT_OTHER)
Admission: RE | Admit: 2019-09-19 | Discharge: 2019-09-19 | Disposition: A | Discharge status: Home / Self Care | Payer: 59 | Source: Ambulatory Visit | Attending: Family | Admitting: Family

## 2019-09-19 DIAGNOSIS — Z1231 Encounter for screening mammogram for malignant neoplasm of breast: Secondary | ICD-10-CM | POA: Insufficient documentation

## 2019-09-23 ENCOUNTER — Ambulatory Visit: Payer: 59 | Admitting: Psychology

## 2019-10-07 ENCOUNTER — Ambulatory Visit: Payer: 59 | Admitting: Psychology

## 2019-10-24 ENCOUNTER — Other Ambulatory Visit: Payer: Self-pay | Admitting: Family

## 2019-10-24 MED FILL — LISINOPRIL-HCTZ 20-25 MG TA: 20-25 | 30 days supply | Qty: 30 | Fill #1

## 2019-10-27 MED FILL — AMLODIPINE BESYLATE 5 MG TA: 5 | 14 days supply | Qty: 14 | Fill #0

## 2019-11-28 MED FILL — LISINOPRIL-HCTZ 20-25 MG TA: 20-25 | 60 days supply | Qty: 60 | Fill #2

## 2020-02-09 ENCOUNTER — Encounter: Payer: Self-pay | Admitting: Family

## 2020-02-09 ENCOUNTER — Ambulatory Visit (INDEPENDENT_AMBULATORY_CARE_PROVIDER_SITE_OTHER): Payer: 59 | Admitting: Family

## 2020-02-09 ENCOUNTER — Other Ambulatory Visit: Payer: Self-pay

## 2020-02-09 VITALS — BP 147/75

## 2020-02-09 DIAGNOSIS — Z0000 Encounter for general adult medical examination without abnormal findings: Secondary | ICD-10-CM

## 2020-02-09 DIAGNOSIS — Z0001 Encounter for general adult medical examination with abnormal findings: Secondary | ICD-10-CM | POA: Diagnosis not present

## 2020-02-09 DIAGNOSIS — J302 Other seasonal allergic rhinitis: Secondary | ICD-10-CM

## 2020-02-09 DIAGNOSIS — I10 Essential (primary) hypertension: Secondary | ICD-10-CM | POA: Diagnosis not present

## 2020-02-09 DIAGNOSIS — R41840 Attention and concentration deficit: Secondary | ICD-10-CM

## 2020-02-09 MED ORDER — METOPROLOL SUCCINATE ER 25 MG PO TB24: 25.0000 mg | ORAL_TABLET | Freq: Every day | ORAL | 3 refills | Status: DC

## 2020-02-09 MED ORDER — LISINOPRIL-HYDROCHLOROTHIAZIDE 20-25 MG PO TABS: 1.0000 | ORAL_TABLET | Freq: Every day | ORAL | 1 refills | Status: DC

## 2020-02-09 MED FILL — LISINOPRIL-HYDROCHLOROTHIAZ: 20-25 | 90 days supply | Qty: 90 | Fill #0

## 2020-02-09 MED FILL — METOPROLOL SUCCINATE ER 25: 25 | 30 days supply | Qty: 30 | Fill #0

## 2020-02-09 NOTE — Progress Notes (Signed)
Virtual Visit via Video Note  I connected with Judy Carroll on 02/09/20 at 12:20 PM EDT by a video enabled telemedicine application and verified that I am speaking with the correct person using two identifiers.  Location: Patient: home Provider: home   I discussed the limitations of evaluation and management by telemedicine and the availability of in person appointments. The patient expressed understanding and agreed to proceed.  History of Present Illness:  Patient presents for follow up and CPX today.  HTN- medications include amlodipine/zestoretic. She reports that she has been out of amlodipine for a few months. Has not had swelling and feels better off of amlodipine.   BP Readings from Last 3 Encounters:  02/09/20 (!) 147/75  12/19/18 (!) 142/88  08/23/18 136/81   Attention deficit- She was scheduled to see Dr. Lurline Hare back in November of 2020. She did not complete.  Does not wish to do work up at this time.   Seasonal allergies- maintained on zyrtec.    Immunizations: flu shot up to date Diet: diet is fair, needs improvement Has been eating out a lot, wants to start cooking more at home 20 oz  Exercise: walks dog 15 minutes each day  Thinks that she has gained weight- has not weighed but clothes are "getting snug."  Wt Readings from Last 3 Encounters:  12/19/18 194 lb 1.3 oz (88 kg)  08/23/18 189 lb (85.7 kg)  07/16/18 184 lb (83.5 kg)    Colonoscopy: due  Pap Smear: hysterectomy Mammogram: 09/19/19 Vision: 12/20 Dental:  4/20   Past Medical History:  Diagnosis Date  . Arthritis   . Chicken pox   . Dysmenorrhea   . Frequent urinary tract infections   . Menorrhagia   . Urinary incontinence      Social History   Socioeconomic History  . Marital status: Single    Spouse name: Not on file  . Number of children: 1  . Years of education: college grad  . Highest education level: Not on file  Occupational History  . Occupation: administrative   Tobacco  Use  . Smoking status: Never Smoker  . Smokeless tobacco: Never Used  Substance and Sexual Activity  . Alcohol use: Yes    Comment: rarely 3 a month  . Drug use: No  . Sexual activity: Yes    Partners: Male    Birth control/protection: Surgical    Comment: robotic hysterectomy  Other Topics Concern  . 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    Social Determinants of Health   Financial Resource Strain:   . Difficulty of Paying Living Expenses:   Food Insecurity:   . Worried About Charity fundraiser in the Last Year:   . Arboriculturist in the Last Year:   Transportation Needs:   . Film/video editor (Medical):   Marland Kitchen Lack of Transportation (Non-Medical):   Physical Activity:   . Days of Exercise per Week:   . Minutes of Exercise per Session:   Stress:   . Feeling of Stress :   Social Connections:   . Frequency of Communication with Friends and Family:   . Frequency of Social Gatherings with Friends and Family:   . Attends Religious Services:   . Active Member of Clubs or Organizations:   .  Attends Archivist Meetings:   Marland Kitchen Marital Status:   Intimate Partner Violence:   . Fear of Current or Ex-Partner:   . Emotionally Abused:   Marland Kitchen Physically Abused:   . Sexually Abused:     Past Surgical History:  Procedure Laterality Date  . BILATERAL SALPINGECTOMY Bilateral 02/17/2013   salpingectomy- unilateral 2014  . BUNIONECTOMY Right 2014  . CYSTOSCOPY N/A 02/17/2013   Procedure: CYSTOSCOPY;  Surgeon: Peri Maris, MD;  Location: [facility];  Service: Gynecology;  Laterality: N/A;  . NO PAST SURGERIES    . ROBOTIC ASSISTED TOTAL HYSTERECTOMY N/A 02/17/2013   Procedure: ROBOTIC ASSISTED TOTAL HYSTERECTOMY;  Surgeon: Peri Maris, MD;  Location: [facility];  Service: Gynecology;   Laterality: N/A;  . WISDOM TOOTH EXTRACTION      Family History  Problem Relation Age of Onset  . Heart failure Mother        chf  . Hypertension Mother   . Diabetes Mother   . Heart disease Mother   . Hyperlipidemia Mother   . COPD Mother   . Depression Mother   . Drug abuse Mother   . Hypertension Father   . Hypertension Sister   . COPD Paternal Grandmother   . Cancer Paternal Grandmother   . Diabetes Paternal Grandmother   . Heart attack Paternal Grandmother   . Heart disease Paternal Grandmother   . Hyperlipidemia Paternal Grandmother     No Known Allergies  Current Outpatient Medications on File Prior to Visit  Medication Sig Dispense Refill  . amLODipine (NORVASC) 5 MG tablet TAKE 1 TABLET BY MOUTH ONCE DAILY **NEEDS ANNUAL PHYSICAL FOR FURTHER REFILLS** 14 tablet 0  . cetirizine (ZYRTEC) 10 MG tablet Take 1 tablet (10 mg total) by mouth daily as needed. 30 tablet 5  . ibuprofen (ADVIL,MOTRIN) 200 MG tablet Take 200 mg by mouth as needed for pain (liquid gels).    Marland Kitchen lisinopril-hydrochlorothiazide (ZESTORETIC) 20-25 MG tablet TAKE 1 TABLET BY MOUTH DAILY. 30 tablet 0  . Multiple Vitamins-Minerals (HAIR/SKIN/NAILS PO) Take by mouth daily.     No current facility-administered medications on file prior to visit.    BP (!) 147/75   LMP 01/27/2013   HR 72  Gen: + weight gain Resp: denies cough ENT: denies nasal congestion GI: denies N/V/D or BRBPR GU: Denies dysuria Psych: denies depression/anxiety Skin: denies rashes Neuro: reports occasional HA's resolve with PRN ibuprofen    Observations/Objective:   Gen: Awake, alert, no acute distress Resp: Breathing is even and non-labored Psych: calm/pleasant demeanor Neuro: Alert and Oriented x 3, + facial symmetry, speech is clear.   Assessment and Plan:  Preventative care- candidate for shingrix and follow up tetanus which we can give next visit. Mammogram up to date.  Due for colonoscopy- referral placed.  Discussed increasing her walking from 15 minutes to 30 minutes a day and eliminating sugared beverages, and preparing more food at home to help with weight loss. Will have her return for routine lab work.   Seasonal Allergies- stable on zyrtec, continue same.   Attention deficit- declines further work up at this time.   HTN- BP above goal. Will add low dose metoprolol once daily.  Continue current dose of zestoretic.    Follow Up Instructions:    I discussed the assessment and treatment plan with the patient. The patient was provided an opportunity to ask questions and all were answered. The patient agreed with the plan and demonstrated an understanding of the instructions.  The patient was advised to call back or seek an in-person evaluation if the symptoms worsen or if the condition fails to improve as anticipated.  Nance Pear, NP

## 2020-02-10 ENCOUNTER — Encounter: Payer: 59 | Admitting: Family

## 2020-02-25 ENCOUNTER — Other Ambulatory Visit: Payer: Self-pay

## 2020-02-25 ENCOUNTER — Other Ambulatory Visit (INDEPENDENT_AMBULATORY_CARE_PROVIDER_SITE_OTHER): Payer: 59

## 2020-02-25 DIAGNOSIS — Z0000 Encounter for general adult medical examination without abnormal findings: Secondary | ICD-10-CM

## 2020-02-25 LAB — LIPID PANEL
Cholesterol: 183 mg/dL (ref 0–200)
HDL: 50.8 mg/dL (ref >39.00)
LDL Cholesterol: 119 mg/dL — ABNORMAL HIGH (ref 0–99)
NonHDL: 132.53
Total CHOL/HDL Ratio: 4
Triglycerides: 69 mg/dL (ref 0.0–149.0)
VLDL: 13.8 mg/dL (ref 0.0–40.0)

## 2020-02-25 LAB — HEPATIC FUNCTION PANEL
ALT: 10 U/L (ref 0–35)
AST: 11 U/L (ref 0–37)
Albumin: 4.1 g/dL (ref 3.5–5.2)
Alkaline Phosphatase: 44 U/L (ref 39–117)
Bilirubin, Direct: 0.1 mg/dL (ref 0.0–0.3)
Total Bilirubin: 0.4 mg/dL (ref 0.2–1.2)
Total Protein: 6.4 g/dL (ref 6.0–8.3)

## 2020-02-25 LAB — CBC WITH DIFFERENTIAL/PLATELET
Basophils Absolute: 0.1 10*3/uL (ref 0.0–0.1)
Basophils Relative: 0.7 % (ref 0.0–3.0)
Eosinophils Absolute: 0.3 10*3/uL (ref 0.0–0.7)
Eosinophils Relative: 3.8 % (ref 0.0–5.0)
HCT: 39.3 % (ref 36.0–46.0)
Hemoglobin: 13 g/dL (ref 12.0–15.0)
Lymphocytes Relative: 28.2 % (ref 12.0–46.0)
Lymphs Abs: 2 10*3/uL (ref 0.7–4.0)
MCHC: 33 g/dL (ref 30.0–36.0)
MCV: 91.7 fl (ref 78.0–100.0)
Monocytes Absolute: 0.5 10*3/uL (ref 0.1–1.0)
Monocytes Relative: 7.7 % (ref 3.0–12.0)
Neutro Abs: 4.3 10*3/uL (ref 1.4–7.7)
Neutrophils Relative %: 59.6 % (ref 43.0–77.0)
Platelets: 253 10*3/uL (ref 150.0–400.0)
RBC: 4.28 Mil/uL (ref 3.87–5.11)
RDW: 14.2 % (ref 11.5–15.5)
WBC: 7.1 10*3/uL (ref 4.0–10.5)

## 2020-02-25 LAB — BASIC METABOLIC PANEL
BUN: 17 mg/dL (ref 6–23)
CO2: 29 mEq/L (ref 19–32)
Calcium: 8.9 mg/dL (ref 8.4–10.5)
Chloride: 105 mEq/L (ref 96–112)
Creatinine, Ser: 0.87 mg/dL (ref 0.40–1.20)
GFR: 82.86 mL/min (ref >60.00)
Glucose, Bld: 90 mg/dL (ref 70–99)
Potassium: 3.5 mEq/L (ref 3.5–5.1)
Sodium: 141 mEq/L (ref 135–145)

## 2020-02-25 LAB — TSH: TSH: 1.64 u[IU]/mL (ref 0.35–4.50)

## 2020-03-12 ENCOUNTER — Ambulatory Visit: Payer: 59 | Admitting: Family

## 2020-03-16 ENCOUNTER — Ambulatory Visit: Payer: 59 | Admitting: Family

## 2020-04-15 ENCOUNTER — Encounter: Payer: Self-pay | Admitting: Family

## 2020-05-17 MED FILL — METOPROLOL SUCCINATE ER 25: 25 | 30 days supply | Qty: 30 | Fill #1

## 2020-05-17 MED FILL — LISINOPRIL-HYDROCHLOROTHIAZ: 20-25 | 90 days supply | Qty: 90 | Fill #1

## 2020-07-01 MED FILL — METOPROLOL SUCCINATE ER 25: 25 | 30 days supply | Qty: 30 | Fill #2

## 2020-07-09 ENCOUNTER — Encounter: Payer: Self-pay | Admitting: Family

## 2020-07-09 ENCOUNTER — Ambulatory Visit (HOSPITAL_BASED_OUTPATIENT_CLINIC_OR_DEPARTMENT_OTHER)
Admission: RE | Admit: 2020-07-09 | Discharge: 2020-07-09 | Disposition: A | Discharge status: Home / Self Care | Payer: 59 | Source: Ambulatory Visit | Attending: Family | Admitting: Family

## 2020-07-09 ENCOUNTER — Other Ambulatory Visit: Payer: Self-pay

## 2020-07-09 ENCOUNTER — Ambulatory Visit: Payer: 59 | Admitting: Family

## 2020-07-09 ENCOUNTER — Other Ambulatory Visit: Payer: Self-pay | Admitting: Family

## 2020-07-09 VITALS — BP 162/91 | HR 51 | Temp 98.6°F | Resp 16 | Wt 205.0 lb

## 2020-07-09 DIAGNOSIS — I10 Essential (primary) hypertension: Secondary | ICD-10-CM

## 2020-07-09 DIAGNOSIS — M50123 Cervical disc disorder at C6-C7 level with radiculopathy: Secondary | ICD-10-CM | POA: Diagnosis not present

## 2020-07-09 DIAGNOSIS — M50122 Cervical disc disorder at C5-C6 level with radiculopathy: Secondary | ICD-10-CM | POA: Diagnosis not present

## 2020-07-09 DIAGNOSIS — M5410 Radiculopathy, site unspecified: Secondary | ICD-10-CM

## 2020-07-09 DIAGNOSIS — M4722 Other spondylosis with radiculopathy, cervical region: Secondary | ICD-10-CM | POA: Diagnosis not present

## 2020-07-09 DIAGNOSIS — M4602 Spinal enthesopathy, cervical region: Secondary | ICD-10-CM | POA: Diagnosis not present

## 2020-07-09 MED ORDER — METHYLPREDNISOLONE 4 MG PO TBPK: ORAL_TABLET | ORAL | 0 refills | Status: DC

## 2020-07-09 MED ORDER — METHOCARBAMOL 500 MG PO TABS: 500.0000 mg | ORAL_TABLET | Freq: Three times a day (TID) | ORAL | 0 refills | Status: AC | PRN

## 2020-07-09 MED ORDER — LISINOPRIL 10 MG PO TABS: 10.0000 mg | ORAL_TABLET | Freq: Every day | ORAL | 1 refills | Status: DC

## 2020-07-09 MED FILL — METHYLPREDNISOLONE 4 MG TBP: 4 | 6 days supply | Qty: 21 | Fill #0

## 2020-07-09 MED FILL — METHOCARBAMOL 500 MG TABS: 500 | 6 days supply | Qty: 20 | Fill #0

## 2020-07-09 MED FILL — LISINOPRIL 10 MG TABS: 10 | 90 days supply | Qty: 90 | Fill #0

## 2020-07-09 NOTE — Patient Instructions (Addendum)
Please start medrol dose pack for your neck pain (steroid medicine). You may use robaxin (muscle relaxer) as needed for neck pain. Please do not drive after taking.  Please add an additional tablet of lisinopril 10mg  once daily to your current blood pressure medicines.

## 2020-07-09 NOTE — Progress Notes (Signed)
Subjective:    Patient ID: Judy Carroll, female    DOB: 06-05-1966, 54 y.o.   MRN: [id number]  HPI  Patient is a 54 yr old female who presents today with chief complaint of left sided neck pain which began on 06/07/20. Pain radiates dow the left arm and is associated with tingling and numbness in the left first and second fingers.  She has been using aleve "muscle and back" but it cause her GI upset.  Then tried advil without help.  She reports that she is sleeping sitting up at night. If she lays down it hurts.   HTN- reports good compliance with medications.  BP Readings from Last 3 Encounters:  07/09/20 (!) 162/91  02/09/20 (!) 147/75  12/19/18 (!) 142/88     Review of Systems See HPI  Past Medical History:  Diagnosis Date   Arthritis    Chicken pox    Dysmenorrhea    Frequent urinary tract infections    Menorrhagia    Urinary incontinence      Social History   Socioeconomic History   Marital status: Single    Spouse name: Not on file   Number of children: 1   Years of education: college grad   Highest education level: Not on file  Occupational History   Occupation: administrative   Tobacco Use   Smoking status: Never Smoker   Smokeless tobacco: Never Used  Scientific laboratory technician Use: Never used  Substance and Sexual Activity   Alcohol use: Yes    Comment: rarely 3 a month   Drug use: No   Sexual activity: Yes    Partners: Male    Birth control/protection: Surgical    Comment: robotic hysterectomy  Other Topics Concern   Not on file  Social History Narrative   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    Social Determinants of Health   Financial Resource Strain:    Difficulty of Paying Living Expenses: Not on file  Food Insecurity:    Worried About Charity fundraiser in the Last Year:  Not on file   [employer] of Food in the Last Year: Not on file  Transportation Needs:    Lack of Transportation (Medical): Not on file   Lack of Transportation (Non-Medical): Not on file  Physical Activity:    Days of Exercise per Week: Not on file   Minutes of Exercise per Session: Not on file  Stress:    Feeling of Stress : Not on file  Social Connections:    Frequency of Communication with Friends and Family: Not on file   Frequency of Social Gatherings with Friends and Family: Not on file   Attends Religious Services: Not on file   Active Member of Clubs or Organizations: Not on file   Attends Archivist Meetings: Not on file   Marital Status: Not on file  Intimate Partner Violence:    Fear of Current or Ex-Partner: Not on file   Emotionally Abused: Not on file   Physically Abused: Not on file   Sexually Abused: Not on file    Past Surgical History:  Procedure Laterality Date   BILATERAL SALPINGECTOMY Bilateral 02/17/2013   salpingectomy- unilateral 2014   BUNIONECTOMY Right 2014   CYSTOSCOPY N/A 02/17/2013   Procedure: CYSTOSCOPY;  Surgeon: Lubertha South Romine,  MD;  Location: [facility];  Service: Gynecology;  Laterality: N/A;   NO PAST SURGERIES     ROBOTIC ASSISTED TOTAL HYSTERECTOMY N/A 02/17/2013   Procedure: ROBOTIC ASSISTED TOTAL HYSTERECTOMY;  Surgeon: Peri Maris, MD;  Location: [facility];  Service: Gynecology;  Laterality: N/A;   WISDOM TOOTH EXTRACTION      Family History  Problem Relation Age of Onset   Heart failure Mother        chf   Hypertension Mother    Diabetes Mother    Heart disease Mother    Hyperlipidemia Mother    COPD Mother    Depression Mother    Drug abuse Mother    Hypertension Father    Hypertension Sister    COPD Paternal Grandmother    Cancer Paternal Grandmother    Diabetes Paternal Grandmother    Heart attack Paternal Grandmother    Heart disease Paternal Grandmother    Hyperlipidemia  Paternal Grandmother     Allergies  Allergen Reactions   Aleve [Naproxen]     Abdominal pain only   Percogesic [Diphenhydramine-Acetaminophen]     Abdominal pain only    Current Outpatient Medications on File Prior to Visit  Medication Sig Dispense Refill   cetirizine (ZYRTEC) 10 MG tablet Take 1 tablet (10 mg total) by mouth daily as needed. 30 tablet 5   lisinopril-hydrochlorothiazide (ZESTORETIC) 20-25 MG tablet Take 1 tablet by mouth daily. 90 tablet 1   metoprolol succinate (TOPROL-XL) 25 MG 24 hr tablet Take 1 tablet (25 mg total) by mouth daily. 30 tablet 3   Multiple Vitamins-Minerals (HAIR/SKIN/NAILS PO) Take by mouth daily.     ibuprofen (ADVIL,MOTRIN) 200 MG tablet Take 200 mg by mouth as needed for pain (liquid gels).     No current facility-administered medications on file prior to visit.    BP (!) 162/91 (BP Location: Right Arm, Patient Position: Sitting, Cuff Size: Large)    Pulse (!) 51    Temp 98.6 F (37 C) (Oral)    Resp 16    Wt 205 lb (93 kg)    LMP 01/27/2013    SpO2 100%    BMI 38.73 kg/m       Objective:   Physical Exam Constitutional:      Appearance: She is well-developed.  Cardiovascular:     Rate and Rhythm: Normal rate and regular rhythm.     Heart sounds: Normal heart sounds. No murmur heard.   Pulmonary:     Effort: Pulmonary effort is normal. No respiratory distress.     Breath sounds: Normal breath sounds. No wheezing.  Neurological:     Comments: Bilateral UE strength is 5/5 R hand grasp 5/5, L hand grasp is 4/5  Psychiatric:        Behavior: Behavior normal.        Thought Content: Thought content normal.        Judgment: Judgment normal.           Assessment & Plan:  HTN- uncontrolled. Will add an additional 10 mg of lisinopril to her regimen.   Cervical radiculopathy- will rx with medrol dose pak and prn robaxin. Obtain x-ray of C spine.  Plan follow up in 2 weeks to reassess pain and her left hand grip  weakness.  This visit occurred during the SARS-CoV-2 public health emergency.  Safety protocols were in place, including screening questions prior to the visit, additional usage of staff PPE, and extensive cleaning of exam room while observing appropriate  contact time as indicated for disinfecting solutions.

## 2020-07-12 ENCOUNTER — Encounter: Payer: Self-pay | Admitting: Family

## 2020-07-12 MED ORDER — TRAMADOL HCL 50 MG PO TABS: 50.0000 mg | ORAL_TABLET | Freq: Three times a day (TID) | ORAL | 0 refills | Status: AC | PRN

## 2020-07-12 MED FILL — traMADol HCL 50 MG TABS: 50 | 5 days supply | Qty: 15 | Fill #0

## 2020-07-23 ENCOUNTER — Encounter: Payer: Self-pay | Admitting: Family

## 2020-07-23 ENCOUNTER — Other Ambulatory Visit: Payer: Self-pay

## 2020-07-23 ENCOUNTER — Ambulatory Visit (INDEPENDENT_AMBULATORY_CARE_PROVIDER_SITE_OTHER): Payer: 59 | Admitting: Family

## 2020-07-23 VITALS — BP 128/79 | HR 51 | Temp 98.8°F | Resp 16 | Ht 60.0 in | Wt 203.0 lb

## 2020-07-23 DIAGNOSIS — M5412 Radiculopathy, cervical region: Secondary | ICD-10-CM | POA: Diagnosis not present

## 2020-07-23 MED ORDER — TRAMADOL HCL 50 MG PO TABS: 50.0000 mg | ORAL_TABLET | Freq: Three times a day (TID) | ORAL | 0 refills | Status: AC | PRN

## 2020-07-23 MED FILL — traMADol HCL 50 MG TABS: 50 | 5 days supply | Qty: 15 | Fill #0

## 2020-07-23 NOTE — Progress Notes (Signed)
Subjective:    Patient ID: Judy Carroll, female    DOB: August 04, 1966, 54 y.o.   MRN: [id number]  HPI  Patient is a 54 yr old female who presents today for follow up of her left sided neck pain.  Last visit we treated her with a medrol dose pak and obtained an x-ray of her c-spine. 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  Review of Systems    see HPI  Past Medical History:  Diagnosis Date  . Arthritis   . Chicken pox   . Dysmenorrhea   . Frequent urinary tract infections   . Menorrhagia   . Urinary incontinence      Social History   Socioeconomic History  . Marital status: Single    Spouse name: Not on file  . Number of children: 1  . Years of education: college grad  . Highest education level: Not on file  Occupational History  . Occupation: administrative   Tobacco Use  . Smoking status: Never Smoker  . Smokeless tobacco: Never Used  Vaping Use  . Vaping Use: Never used  Substance and Sexual Activity  . Alcohol use: Yes    Comment: rarely 3 a month  . Drug use: No  . Sexual activity: Yes    Partners: Male    Birth control/protection: Surgical    Comment: robotic hysterectomy  Other Topics Concern  . 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    Social Determinants of Health   Financial Resource Strain:   . Difficulty of Paying Living Expenses: Not on file  Food Insecurity:   . Worried About Charity fundraiser in the Last Year: Not on file  . Ran Out of Food in the Last Year: Not on file  Transportation Needs:   . Lack of Transportation (Medical): Not on file  . Lack of Transportation (Non-Medical): Not on file  Physical Activity:   . Days of Exercise per Week: Not on file  . Minutes of Exercise per Session: Not on  file  Stress:   . Feeling of Stress : Not on file  Social Connections:   . Frequency of Communication with Friends and Family: Not on file  . Frequency of Social Gatherings with Friends and Family: Not on file  . Attends Religious Services: Not on file  . Active Member of Clubs or Organizations: Not on file  . Attends Archivist Meetings: Not on file  . Marital Status: Not on file  Intimate Partner Violence:   . Fear of Current or Ex-Partner: Not on file  . Emotionally Abused: Not on file  . Physically Abused: Not on file  . Sexually Abused: Not on file    Past Surgical History:  Procedure Laterality Date  . BILATERAL SALPINGECTOMY Bilateral 02/17/2013   salpingectomy- unilateral 2014  . BUNIONECTOMY Right 2014  . CYSTOSCOPY N/A 02/17/2013   Procedure: CYSTOSCOPY;  Surgeon: Peri Maris, MD;  Location: [facility];  Service: Gynecology;  Laterality: N/A;  . NO PAST SURGERIES    . ROBOTIC ASSISTED TOTAL HYSTERECTOMY N/A 02/17/2013   Procedure: ROBOTIC ASSISTED TOTAL HYSTERECTOMY;  Surgeon: Peri Maris, MD;  Location: [facility];  Service: Gynecology;  Laterality: N/A;  . WISDOM TOOTH EXTRACTION  Family History  Problem Relation Age of Onset  . Heart failure Mother        chf  . Hypertension Mother   . Diabetes Mother   . Heart disease Mother   . Hyperlipidemia Mother   . COPD Mother   . Depression Mother   . Drug abuse Mother   . Hypertension Father   . Hypertension Sister   . COPD Paternal Grandmother   . Cancer Paternal Grandmother   . Diabetes Paternal Grandmother   . Heart attack Paternal Grandmother   . Heart disease Paternal Grandmother   . Hyperlipidemia Paternal Grandmother     Allergies  Allergen Reactions  . Aleve [Naproxen]     Abdominal pain only  . Percogesic [Diphenhydramine-Acetaminophen]     Abdominal pain only    Current Outpatient Medications on File Prior to Visit  Medication Sig Dispense Refill  . cetirizine (ZYRTEC) 10 MG  tablet Take 1 tablet (10 mg total) by mouth daily as needed. 30 tablet 5  . lisinopril (ZESTRIL) 10 MG tablet Take 1 tablet (10 mg total) by mouth daily. 90 tablet 1  . lisinopril-hydrochlorothiazide (ZESTORETIC) 20-25 MG tablet Take 1 tablet by mouth daily. 90 tablet 1  . methocarbamol (ROBAXIN) 500 MG tablet Take 1 tablet (500 mg total) by mouth every 8 (eight) hours as needed for muscle spasms. 20 tablet 0  . metoprolol succinate (TOPROL-XL) 25 MG 24 hr tablet Take 1 tablet (25 mg total) by mouth daily. 30 tablet 3  . Multiple Vitamins-Minerals (HAIR/SKIN/NAILS PO) Take by mouth daily.     No current facility-administered medications on file prior to visit.    BP 128/79 (BP Location: Right Arm, Patient Position: Sitting, Cuff Size: Large)   Pulse (!) 51   Temp 98.8 F (37.1 C) (Oral)   Resp 16   Ht 5' (1.524 m)   Wt 203 lb (92.1 kg)   LMP 01/27/2013   SpO2 100%   BMI 39.65 kg/m    Objective:   Physical Exam Constitutional:      Appearance: She is well-developed.  Neck:     Thyroid: No thyromegaly.  Cardiovascular:     Rate and Rhythm: Normal rate and regular rhythm.     Heart sounds: Normal heart sounds. No murmur heard.   Pulmonary:     Effort: Pulmonary effort is normal. No respiratory distress.     Breath sounds: Normal breath sounds. No wheezing.  Musculoskeletal:     Cervical back: Neck supple.  Skin:    General: Skin is warm and dry.  Neurological:     Mental Status: She is alert and oriented to person, place, and time.     Comments: Bilateral hand grasps and UE strength is 5/5/  Psychiatric:        Behavior: Behavior normal.        Thought Content: Thought content normal.        Judgment: Judgment normal.           Assessment & Plan:  Cervical radiculopathy- some improvement but not resolved. Will refer to orthopedics for further evaluation. Continue tramadol prn.   This visit occurred during the SARS-CoV-2 public health emergency.  Safety protocols  were in place, including screening questions prior to the visit, additional usage of staff PPE, and extensive cleaning of exam room while observing appropriate contact time as indicated for disinfecting solutions.

## 2020-07-23 NOTE — Patient Instructions (Signed)
You should be contacted about your referral to Orthopedics.

## 2020-07-24 ENCOUNTER — Encounter: Payer: Self-pay | Admitting: Family

## 2020-08-02 ENCOUNTER — Ambulatory Visit: Payer: 59 | Admitting: Physician Assistant

## 2020-08-02 ENCOUNTER — Encounter: Payer: Self-pay | Admitting: Physician Assistant

## 2020-08-02 ENCOUNTER — Ambulatory Visit: Payer: 59 | Admitting: Family Medicine

## 2020-08-02 VITALS — Ht 60.0 in | Wt 203.0 lb

## 2020-08-02 DIAGNOSIS — M5412 Radiculopathy, cervical region: Secondary | ICD-10-CM

## 2020-08-02 DIAGNOSIS — M542 Cervicalgia: Secondary | ICD-10-CM

## 2020-08-02 MED ORDER — PREDNISONE 10 MG PO TABS: 20.0000 mg | ORAL_TABLET | Freq: Every day | ORAL | 0 refills | Status: AC

## 2020-08-02 MED FILL — METOPROLOL SUCCINATE ER 25: 25 | 30 days supply | Qty: 30 | Fill #3

## 2020-08-02 MED FILL — predniSONE 10 MG TABS: 10 | 30 days supply | Qty: 60 | Fill #0

## 2020-08-02 NOTE — Progress Notes (Signed)
Office Visit Note   Patient: Judy Carroll           Date of Birth: [birth year]           MRN: [id number] Visit Date: 08/02/2020              Requested by: Debbrah Alar, NP [address] [place],  [place] [id number] PCP: Debbrah Alar, NP  Chief Complaint  Patient presents with  . Neck - Pain      HPI: This is a pleasant 54 year old woman with a 81-month history of cervical radiculopathy.  She has had occasional nerve type pain in her neck running down her left arm but it not been a problem.  In July she was bending over to pick up a curling iron and had immediate onset of pain that she describes as burning in her neck running down her shoulder to her hand.  She is now getting numbness and weakness in her left hand numbness focally in the thumb and index finger.  She has had an x-ray done by her primary care provider.  She also has had a course of steroid and muscle relaxant with little improvement in her symptoms  Assessment & Plan: Visit Diagnoses:  1. Cervicalgia   2. Radiculopathy, cervical region     Plan: Patient has failed conservative treatment and her symptoms are progressing.  I recommend an MRI and referral to Dr. Ernestina Patches following the MRI for possible epidural steroid injections.  She had requested a female provider and I did tell her that we could arrange for a female medical assistant or nurse to be in the room with her and she is fine with this option in the meantime I will place her back on prednisone as this did give her a little bit of relief but certainly did not solve her painful symptoms  Follow-Up Instructions: No follow-ups on file.   Ortho Exam  Patient is alert, oriented, no adenopathy, well-dressed, normal affect, normal respiratory effort. Focused examination she has no tenderness or deformity in the cervical spine itself.  As I palpate out to the left shoulder and down the arm she has some reproduction of her painful symptoms.  She  has paresthesias in the thumb and index finger on the dorsal and palmar surfaces.  She has slightly decreased grip strength compared to the right side.  Pain is also recreated with extension of her left wrist.  No atrophy of muscle is noted at this time.  No other right-sided symptoms or lower extremity symptoms X-rays reviewed today do demonstrate some degenerative changes and narrowing in the cervical spine..  Imaging: No results found. No images are attached to the encounter.  Labs: No results found for: HGBA1C, ESRSEDRATE, CRP, LABURIC, REPTSTATUS, GRAMSTAIN, CULT, LABORGA   Lab Results  Component Value Date   ALBUMIN 4.1 02/25/2020   ALBUMIN 4.3 08/23/2018    No results found for: MG No results found for: VD25OH  No results found for: PREALBUMIN CBC EXTENDED Latest Ref Rng & Units 02/25/2020 08/23/2018 02/11/2013  WBC 4.0 - 10.5 K/uL 7.1 8.1 8.7  RBC 3.87 - 5.11 Mil/uL 4.28 4.24 4.03  HGB 12.0 - 15.0 g/dL 13.0 12.9 12.3  HCT 36 - 46 % 39.3 39.4 37.3  PLT 150 - 400 K/uL 253.0 332.0 383  NEUTROABS 1.4 - 7.7 K/uL 4.3 4.8 -  LYMPHSABS 0.7 - 4.0 K/uL 2.0 2.3 -     Body mass index is 39.65 kg/m.  Orders:  Orders Placed This Encounter  Procedures  . MR Cervical Spine w/o contrast  . Ambulatory referral to Physical Medicine Rehab   Meds ordered this encounter  Medications  . predniSONE (DELTASONE) 10 MG tablet    Sig: Take 2 tablets (20 mg total) by mouth daily with breakfast.    Dispense:  60 tablet    Refill:  0     Procedures: No procedures performed  Clinical Data: No additional findings.  ROS:  All other systems negative, except as noted in the HPI. Review of Systems  Objective: Vital Signs: Ht 5' (1.524 m)   Wt 203 lb (92.1 kg)   LMP 01/27/2013   BMI 39.65 kg/m   Specialty Comments:  No specialty comments available.  PMFS History: Patient Active Problem List   Diagnosis Date Noted  . Insomnia 01/24/2016  . Seasonal allergic rhinitis due to  pollen 01/24/2016  . Obesity, unspecified 03/10/2014    Class: Chronic  . Essential hypertension with goal blood pressure less than 130/80 02/04/2014  . Vitamin D deficiency 02/04/2014   Past Medical History:  Diagnosis Date  . Arthritis   . Chicken pox   . Dysmenorrhea   . Frequent urinary tract infections   . Menorrhagia   . Urinary incontinence     Family History  Problem Relation Age of Onset  . Heart failure Mother        chf  . Hypertension Mother   . Diabetes Mother   . Heart disease Mother   . Hyperlipidemia Mother   . COPD Mother   . Depression Mother   . Drug abuse Mother   . Hypertension Father   . Hypertension Sister   . COPD Paternal Grandmother   . Cancer Paternal Grandmother   . Diabetes Paternal Grandmother   . Heart attack Paternal Grandmother   . Heart disease Paternal Grandmother   . Hyperlipidemia Paternal Grandmother     Past Surgical History:  Procedure Laterality Date  . BILATERAL SALPINGECTOMY Bilateral 02/17/2013   salpingectomy- unilateral 2014  . BUNIONECTOMY Right 2014  . CYSTOSCOPY N/A 02/17/2013   Procedure: CYSTOSCOPY;  Surgeon: Peri Maris, MD;  Location: [facility];  Service: Gynecology;  Laterality: N/A;  . NO PAST SURGERIES    . ROBOTIC ASSISTED TOTAL HYSTERECTOMY N/A 02/17/2013   Procedure: ROBOTIC ASSISTED TOTAL HYSTERECTOMY;  Surgeon: Peri Maris, MD;  Location: [facility];  Service: Gynecology;  Laterality: N/A;  . WISDOM TOOTH EXTRACTION     Social History   Occupational History  . Occupation: administrative   Tobacco Use  . Smoking status: Never Smoker  . Smokeless tobacco: Never Used  Vaping Use  . Vaping Use: Never used  Substance and Sexual Activity  . Alcohol use: Yes    Comment: rarely 3 a month  . Drug use: No  . Sexual activity: Yes    Partners: Male    Birth control/protection: Surgical    Comment: robotic hysterectomy

## 2020-08-18 ENCOUNTER — Telehealth: Payer: Self-pay | Admitting: *Deleted

## 2020-08-18 NOTE — Telephone Encounter (Signed)
Left vm with pt to return call, pt states wants to go elsewhere to have her MRI due to bad experience at [facility]

## 2020-08-19 NOTE — Telephone Encounter (Signed)
Pt called back and she has requested to be scheduled at the [facility], I will send order over and they will contact pt to schedule appt, pt is aware once appt has been scheduled to contact office to schedule followup with [facility] for MRi review and possible ESI injections per Bevely Palmer Persons notes.

## 2020-08-20 ENCOUNTER — Other Ambulatory Visit: Payer: 59

## 2020-08-21 ENCOUNTER — Ambulatory Visit (HOSPITAL_BASED_OUTPATIENT_CLINIC_OR_DEPARTMENT_OTHER)
Admission: RE | Admit: 2020-08-21 | Discharge: 2020-08-21 | Disposition: A | Discharge status: Home / Self Care | Payer: 59 | Source: Ambulatory Visit | Attending: Physician Assistant | Admitting: Physician Assistant

## 2020-08-21 ENCOUNTER — Other Ambulatory Visit: Payer: Self-pay

## 2020-08-21 DIAGNOSIS — M5412 Radiculopathy, cervical region: Secondary | ICD-10-CM

## 2020-08-28 ENCOUNTER — Ambulatory Visit (HOSPITAL_BASED_OUTPATIENT_CLINIC_OR_DEPARTMENT_OTHER)
Admission: RE | Admit: 2020-08-28 | Discharge: 2020-08-28 | Disposition: A | Discharge status: Home / Self Care | Payer: 59 | Source: Ambulatory Visit | Attending: Physician Assistant | Admitting: Physician Assistant

## 2020-08-28 ENCOUNTER — Other Ambulatory Visit: Payer: Self-pay

## 2020-08-28 DIAGNOSIS — M5412 Radiculopathy, cervical region: Secondary | ICD-10-CM | POA: Diagnosis not present

## 2020-08-28 DIAGNOSIS — M542 Cervicalgia: Secondary | ICD-10-CM | POA: Diagnosis not present

## 2020-09-27 ENCOUNTER — Other Ambulatory Visit: Payer: Self-pay | Admitting: Family

## 2020-09-27 MED FILL — LISINOPRIL 10 MG TABS: 10 | 90 days supply | Qty: 90 | Fill #1

## 2020-09-28 MED FILL — METOPROLOL SUCCINATE ER 25: 25 | 90 days supply | Qty: 90 | Fill #0

## 2020-09-28 MED FILL — LISINOPRIL-HCTZ 20-25 MG TA: 20-25 | 90 days supply | Qty: 90 | Fill #0

## 2020-10-04 ENCOUNTER — Ambulatory Visit: Payer: Self-pay

## 2020-10-04 ENCOUNTER — Encounter: Payer: Self-pay | Admitting: Physical Medicine and Rehabilitation

## 2020-10-04 ENCOUNTER — Other Ambulatory Visit: Payer: Self-pay

## 2020-10-04 ENCOUNTER — Telehealth: Payer: Self-pay

## 2020-10-04 ENCOUNTER — Ambulatory Visit: Payer: 59 | Admitting: Physical Medicine and Rehabilitation

## 2020-10-04 VITALS — BP 155/98 | HR 65

## 2020-10-04 DIAGNOSIS — M5412 Radiculopathy, cervical region: Secondary | ICD-10-CM | POA: Diagnosis not present

## 2020-10-04 MED ORDER — METHYLPREDNISOLONE ACETATE 80 MG/ML IJ SUSP
40.0000 mg | Freq: Once | INTRAMUSCULAR | Status: AC
  Administered 2020-10-04: 40 mg

## 2020-10-04 NOTE — Progress Notes (Signed)
Pt state neck pain that travel down her left arm to her elbow. Pt state figures feeling numb at times. Pt state it a  pain that didn't go way. Pt state she deals with the pain.  Numeric Pain Rating Scale and Functional Assessment Average Pain 8   In the last MONTH (on 0-10 scale) has pain interfered with the following?  1. General activity like being  able to carry out your everyday physical activities such as walking, climbing stairs, carrying groceries, or moving a chair?  Rating(10)   +Driver, -BT, -Dye Allergies.

## 2020-10-05 NOTE — Telephone Encounter (Signed)
Error

## 2020-11-23 NOTE — Procedures (Signed)
Cervical Epidural Steroid Injection - Interlaminar Approach with Fluoroscopic Guidance  Patient: Judy Carroll      Date of Birth: 1966-02-12 MRN: [id number] PCP: Sandford Craze, NP      Visit Date: 10/04/2020   Universal Protocol:    Date/Time: 01/04/225:36 AM  Consent Given By: the patient  Position: PRONE  Additional Comments: Vital signs were monitored before and after the procedure. Patient was prepped and draped in the usual sterile fashion. The correct patient, procedure, and site was verified.   Injection Procedure Details:   Procedure diagnoses: Cervical radiculopathy [M54.12]    Meds Administered:  Meds ordered this encounter  Medications  . methylPREDNISolone acetate (DEPO-MEDROL) injection 40 mg     Laterality: Left  Location/Site: C7-T1  Needle: 3.5 in., 20 ga. Tuohy  Needle Placement: Paramedian epidural space  Findings:  -Comments: Excellent flow of contrast into the epidural space.  Procedure Details: Using a paramedian approach from the side mentioned above, the region overlying the inferior lamina was localized under fluoroscopic visualization and the soft tissues overlying this structure were infiltrated with 4 ml. of 1% Lidocaine without Epinephrine. A # 20 gauge, Tuohy needle was inserted into the epidural space using a paramedian approach.  The epidural space was localized using loss of resistance along with contralateral oblique bi-planar fluoroscopic views.  After negative aspirate for air, blood, and CSF, a 2 ml. volume of Isovue-250 was injected into the epidural space and the flow of contrast was observed. Radiographs were obtained for documentation purposes.   The injectate was administered into the level noted above.  Additional Comments:  The patient tolerated the procedure well Dressing: 2 x 2 sterile gauze and Band-Aid    Post-procedure details: Patient was observed during the procedure. Post-procedure instructions were  reviewed.  Patient left the clinic in stable condition.

## 2020-11-23 NOTE — Progress Notes (Signed)
Judy Carroll - 55 y.o. female MRN [id number]  Date of birth: 06-19-1966  Office Visit Note: Visit Date: 10/04/2020 PCP: Debbrah Alar, NP Referred by: Debbrah Alar, NP  Subjective: Chief Complaint  Patient presents with  . Neck - Pain  . Left Elbow - Pain  . Left Arm - Pain  . Left Hand - Pain   HPI:  Judy Carroll is a 55 y.o. female who comes in today at the request of [place], PA-C for planned Left C7-T1 Cervical epidural steroid injection with fluoroscopic guidance.  The patient has failed conservative care including home exercise, medications, time and activity modification.  This injection will be diagnostic and hopefully therapeutic.  Please see requesting physician notes for further details and justification.  MRI reviewed with images and spine model.  MRI reviewed in the note below.   ROS Otherwise per HPI.  Assessment & Plan: Visit Diagnoses:    ICD-10-CM   1. Cervical radiculopathy  M54.12 XR C-ARM NO REPORT    Epidural Steroid injection    methylPREDNISolone acetate (DEPO-MEDROL) injection 40 mg    Plan: No additional findings.   Meds & Orders:  Meds ordered this encounter  Medications  . methylPREDNISolone acetate (DEPO-MEDROL) injection 40 mg    Orders Placed This Encounter  Procedures  . XR C-ARM NO REPORT  . Epidural Steroid injection    Follow-up: Return if symptoms worsen or fail to improve.   Procedures: No procedures performed  Cervical Epidural Steroid Injection - Interlaminar Approach with Fluoroscopic Guidance  Patient: Judy Carroll      Date of Birth: 1966/04/28 MRN: [id number] PCP: Debbrah Alar, NP      Visit Date: 10/04/2020   Universal Protocol:    Date/Time: 01/04/225:36 AM  Consent Given By: the patient  Position: PRONE  Additional Comments: Vital signs were monitored before and after the procedure. Patient was prepped and draped in the usual sterile fashion. The correct patient, procedure,  and site was verified.   Injection Procedure Details:   Procedure diagnoses: Cervical radiculopathy [M54.12]    Meds Administered:  Meds ordered this encounter  Medications  . methylPREDNISolone acetate (DEPO-MEDROL) injection 40 mg     Laterality: Left  Location/Site: C7-T1  Needle: 3.5 in., 20 ga. Tuohy  Needle Placement: Paramedian epidural space  Findings:  -Comments: Excellent flow of contrast into the epidural space.  Procedure Details: Using a paramedian approach from the side mentioned above, the region overlying the inferior lamina was localized under fluoroscopic visualization and the soft tissues overlying this structure were infiltrated with 4 ml. of 1% Lidocaine without Epinephrine. A # 20 gauge, Tuohy needle was inserted into the epidural space using a paramedian approach.  The epidural space was localized using loss of resistance along with contralateral oblique bi-planar fluoroscopic views.  After negative aspirate for air, blood, and CSF, a 2 ml. volume of Isovue-250 was injected into the epidural space and the flow of contrast was observed. Radiographs were obtained for documentation purposes.   The injectate was administered into the level noted above.  Additional Comments:  The patient tolerated the procedure well Dressing: 2 x 2 sterile gauze and Band-Aid    Post-procedure details: Patient was observed during the procedure. Post-procedure instructions were reviewed.  Patient left the clinic in stable condition.     Clinical History: MRI CERVICAL SPINE WITHOUT CONTRAST  TECHNIQUE: Multiplanar, multisequence MR imaging of the cervical spine was performed. No intravenous contrast was administered.  COMPARISON:  Prior radiograph from 07/09/2020.  FINDINGS: Alignment: Straightening with slight reversal of the normal cervical lordosis. No listhesis.  Vertebrae: Vertebral body height maintained without acute or chronic fracture. Bone marrow  signal intensity within normal limits. No discrete or worrisome osseous lesions. No abnormal marrow edema.  Cord: Normal signal and morphology.  Posterior Fossa, vertebral arteries, paraspinal tissues: Visualized brain and posterior fossa within normal limits. Craniocervical junction normal. Paraspinous and prevertebral soft tissues within normal limits. Normal flow voids seen within the vertebral arteries bilaterally.  Disc levels:  C2-C3: Unremarkable.  C3-C4: Mild annular disc bulge. No spinal stenosis. Foramina remain patent.  C4-C5: Mild disc bulge with uncovertebral spurring. Mild flattening of the ventral thecal sac without significant spinal stenosis or cord deformity. Foramina remain patent.  C5-C6: Mild disc bulge with left greater than right uncinate spurring. No significant spinal stenosis. Left foraminal disc osteophyte complex encroaches upon the left neural foramen with resultant moderate left C6 foraminal stenosis. No significant right foraminal narrowing.  C6-C7: Diffuse disc bulge with left greater than right uncovertebral hypertrophy. Mild flattening of the ventral thecal sac without significant spinal stenosis. Moderate left C7 foraminal narrowing. No significant right foraminal narrowing.  C7-T1:  Minimal disc bulge.  Mild facet hypertrophy.  No stenosis.  Visualized upper thoracic spine demonstrates no significant finding.  IMPRESSION: 1. Left foraminal disc osteophyte complex at C5-6 with resultant moderate left C6 foraminal stenosis. 2. Disc bulge with left greater than right uncovertebral hypertrophy at C6-7 with resultant moderate left C7 foraminal stenosis. 3. Additional mild noncompressive disc bulging at C3-4 and C4-5 without significant stenosis or impingement.   Electronically Signed   By: Rise Mu M.D.   On: 08/29/2020 00:36     Objective:  VS:  HT:    WT:   BMI:     BP:(!) 155/98  HR:65bpm  TEMP: ( )   RESP:  Physical Exam Vitals and nursing note reviewed.  Constitutional:      General: She is not in acute distress.    Appearance: Normal appearance. She is not ill-appearing.  HENT:     Head: Normocephalic and atraumatic.     Right Ear: External ear normal.     Left Ear: External ear normal.  Eyes:     Extraocular Movements: Extraocular movements intact.  Cardiovascular:     Rate and Rhythm: Normal rate.     Pulses: Normal pulses.  Musculoskeletal:     Cervical back: Tenderness present. No rigidity.     Right lower leg: No edema.     Left lower leg: No edema.     Comments: Patient has good strength in the upper extremities including 5 out of 5 strength in wrist extension long finger flexion and APB.  There is no atrophy of the hands intrinsically.  There is a negative Hoffmann's test.   Lymphadenopathy:     Cervical: No cervical adenopathy.  Skin:    Findings: No erythema, lesion or rash.  Neurological:     General: No focal deficit present.     Mental Status: She is alert and oriented to person, place, and time.     Sensory: No sensory deficit.     Motor: No weakness or abnormal muscle tone.     Coordination: Coordination normal.  Psychiatric:        Mood and Affect: Mood normal.        Behavior: Behavior normal.      Imaging: No results found.

## 2021-02-20 ENCOUNTER — Other Ambulatory Visit (HOSPITAL_BASED_OUTPATIENT_CLINIC_OR_DEPARTMENT_OTHER): Payer: Self-pay

## 2021-04-04 ENCOUNTER — Other Ambulatory Visit: Payer: Self-pay | Admitting: Family

## 2021-04-04 IMAGING — DX DG CERVICAL SPINE COMPLETE 4+V
7 series · 7 of 7 positions shown · non-contrast
Comparison: None.

CLINICAL DATA: Cervical radiculopathy with left arm pain

EXAM:
CERVICAL SPINE - COMPLETE 4+ VIEW

[c-spine lat]
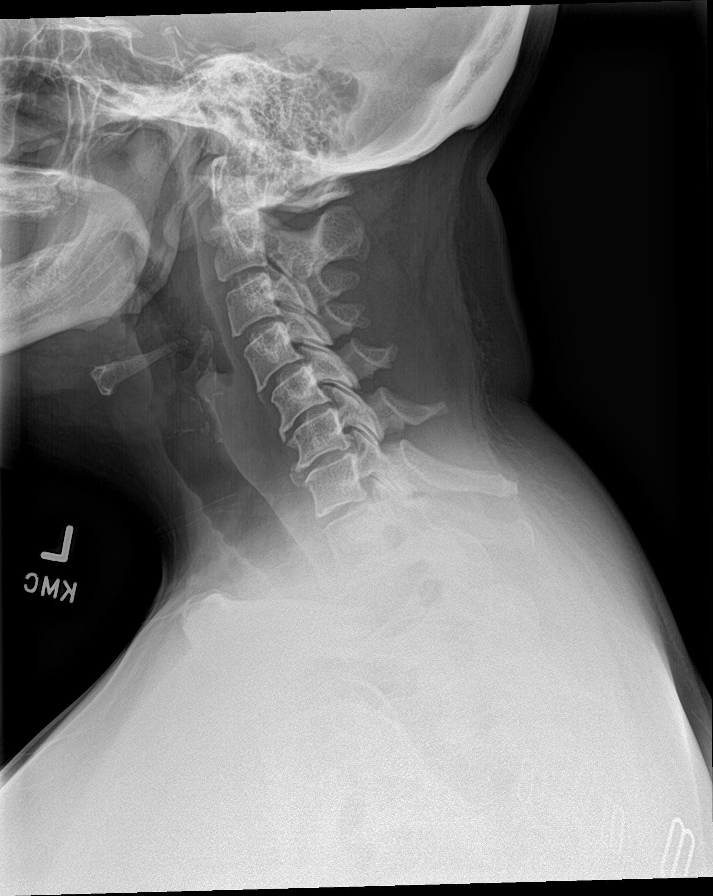

[c-spine obl (1 of 2)]
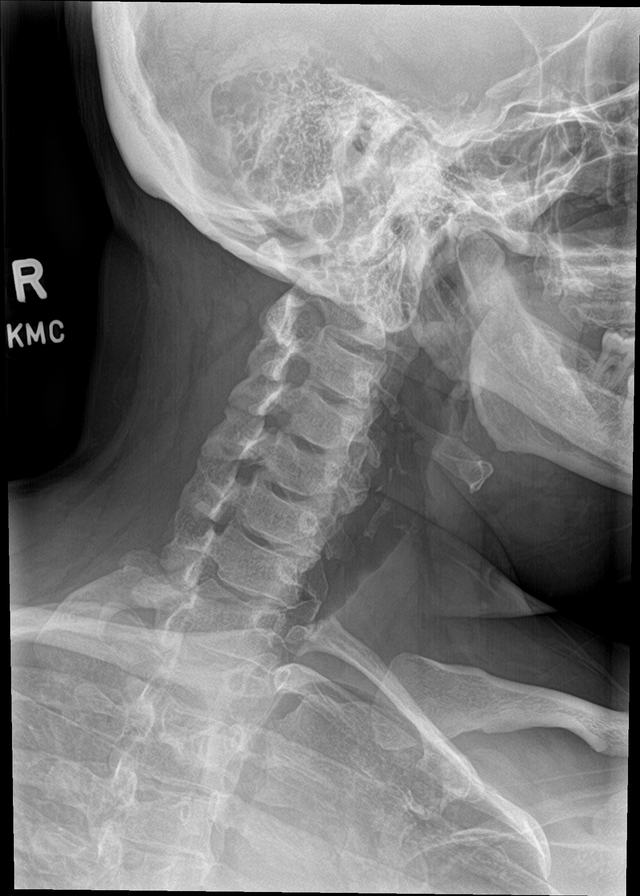

[c-spine obl (2 of 2)]
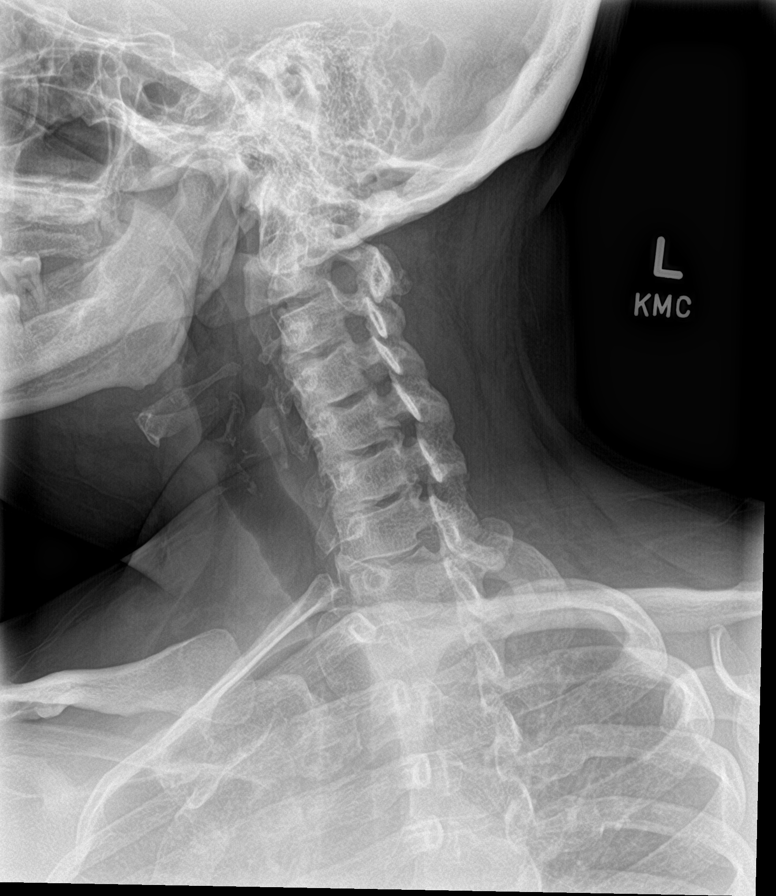

[c-spine ap]
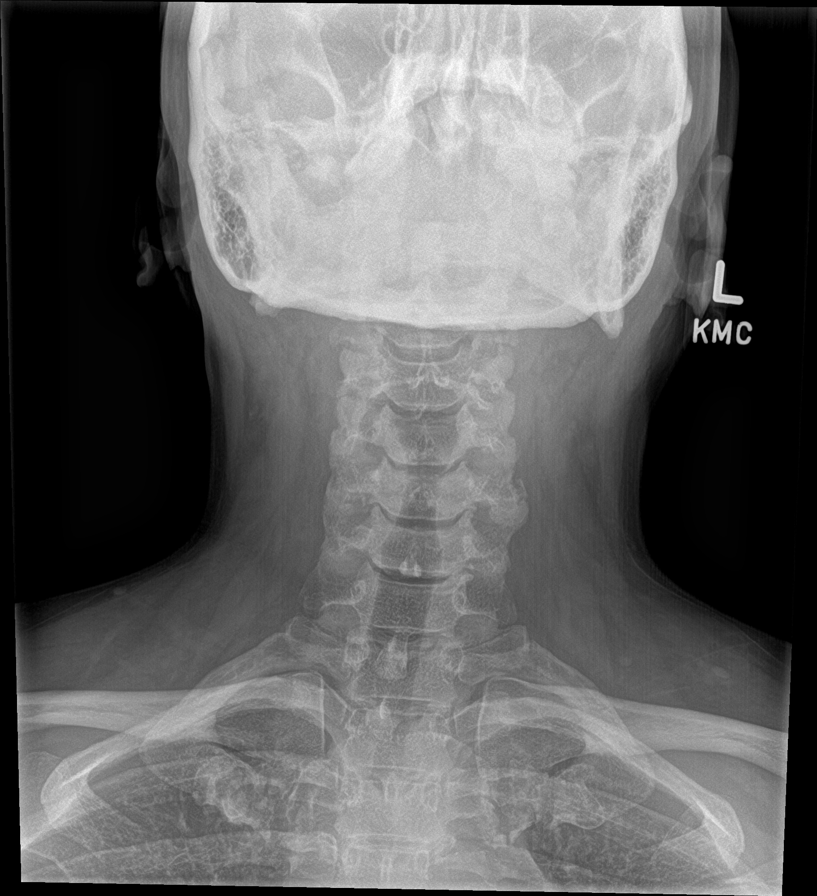

[c-spine open mouth (1 of 2)]
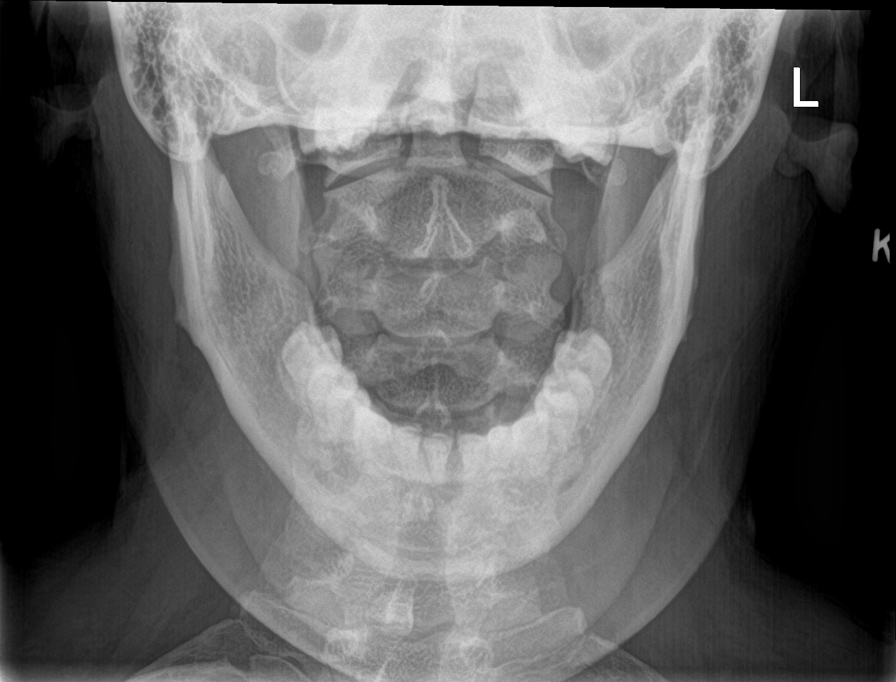

[c-spine swimmers]
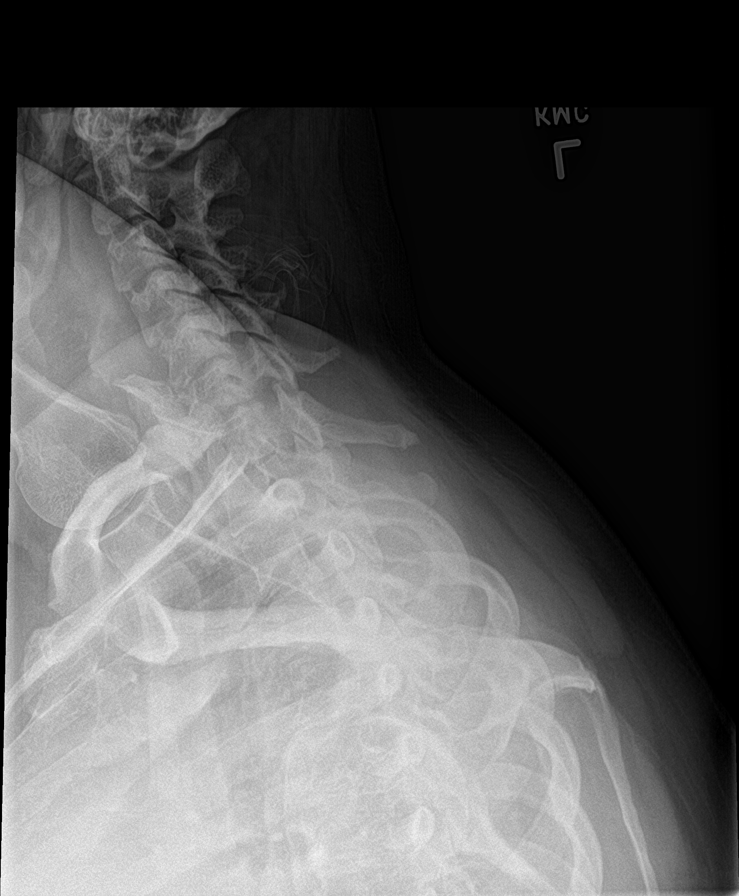

[c-spine open mouth (2 of 2)]
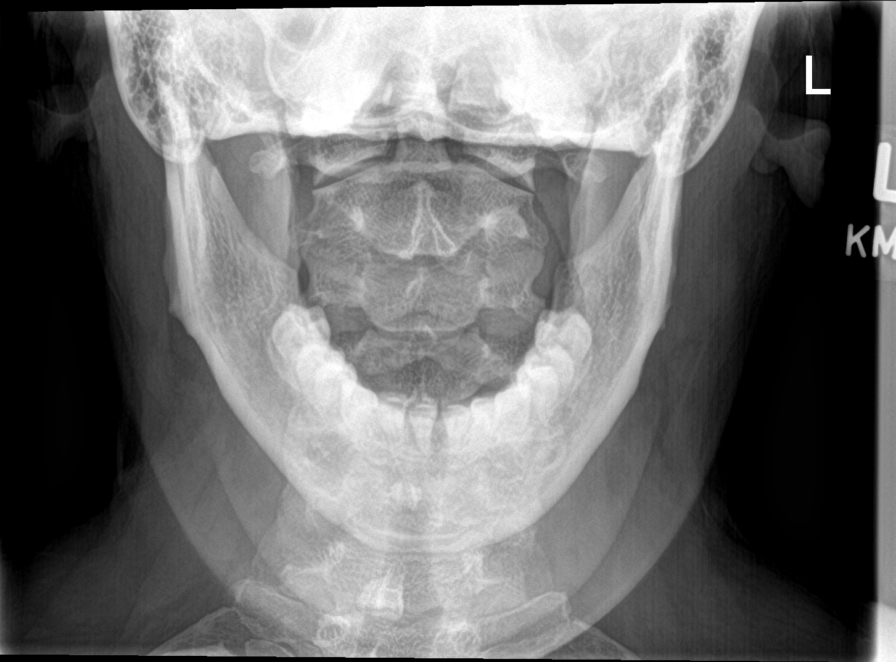

[7 of 7 positions shown; findings below may reference images not displayed]

FINDINGS: Mild anterolisthesis C4-5. Remaining alignment normal. Negative for
fracture or mass. Soft tissues intact.

Mild disc degeneration and spurring at C4-5, C5-6, C6-7. Moderate
left foraminal narrowing C5-6 and C6-7 due to spurring. Right
foramen are patent.
IMPRESSION: Disc degeneration and spurring with left foraminal narrowing C5-6
and C6-7.

## 2021-04-06 ENCOUNTER — Other Ambulatory Visit: Payer: Self-pay | Admitting: Family

## 2021-04-06 NOTE — Telephone Encounter (Signed)
Pt has not been seen since 09/2020. She needs a follow up appt. I have attempted to call to get that scheduled with no luck and I have left a message to call back.

## 2023-07-20 ENCOUNTER — Other Ambulatory Visit: Payer: Self-pay

## 2024-10-20 ENCOUNTER — Other Ambulatory Visit: Payer: Self-pay
